# Patient Record
Sex: Male | Born: 1945
Health system: Southern US, Community
[De-identification: ages and names within clinical notes are randomized; demographics above are authoritative.]

## PROBLEM LIST (undated history)

## (undated) DIAGNOSIS — I2510 Atherosclerotic heart disease of native coronary artery without angina pectoris: Secondary | ICD-10-CM

## (undated) DIAGNOSIS — C801 Malignant (primary) neoplasm, unspecified: Secondary | ICD-10-CM

## (undated) DIAGNOSIS — K649 Unspecified hemorrhoids: Secondary | ICD-10-CM

## (undated) HISTORY — PX: NO PAST SURGERIES: SHX2092

---

## 1997-11-09 ENCOUNTER — Observation Stay (HOSPITAL_COMMUNITY): Admission: AD | Admit: 1997-11-09 | Discharge: 1997-11-10 | Payer: Self-pay | Admitting: Cardiology

## 2000-01-21 ENCOUNTER — Emergency Department (HOSPITAL_COMMUNITY): Admission: EM | Admit: 2000-01-21 | Discharge: 2000-01-21 | Payer: Self-pay | Admitting: Emergency Medicine

## 2000-01-21 ENCOUNTER — Encounter: Payer: Self-pay | Admitting: Emergency Medicine

## 2000-01-21 ENCOUNTER — Encounter: Payer: Self-pay | Admitting: *Deleted

## 2001-10-03 ENCOUNTER — Encounter: Admission: RE | Admit: 2001-10-03 | Discharge: 2001-10-03 | Payer: Self-pay | Admitting: Family Medicine

## 2001-10-03 ENCOUNTER — Encounter: Payer: Self-pay | Admitting: Family Medicine

## 2004-08-27 ENCOUNTER — Emergency Department (HOSPITAL_COMMUNITY): Admission: EM | Admit: 2004-08-27 | Discharge: 2004-08-27 | Payer: Self-pay | Admitting: Family Medicine

## 2004-12-21 ENCOUNTER — Encounter: Admission: RE | Admit: 2004-12-21 | Discharge: 2004-12-21 | Payer: Self-pay | Admitting: Occupational Medicine

## 2005-12-11 ENCOUNTER — Encounter: Admission: RE | Admit: 2005-12-11 | Discharge: 2005-12-11 | Payer: Self-pay | Admitting: Gastroenterology

## 2006-01-01 IMAGING — CR DG HUMERUS 2V *L*
4 series · 4 of 4 positions shown · non-contrast
Comparison: None.

CLINICAL DATA: Fell off ladder ? pain entire humerus.  
 LEFT HUMERUS ? 2 VIEW:

[view not recorded (1 of 4)]
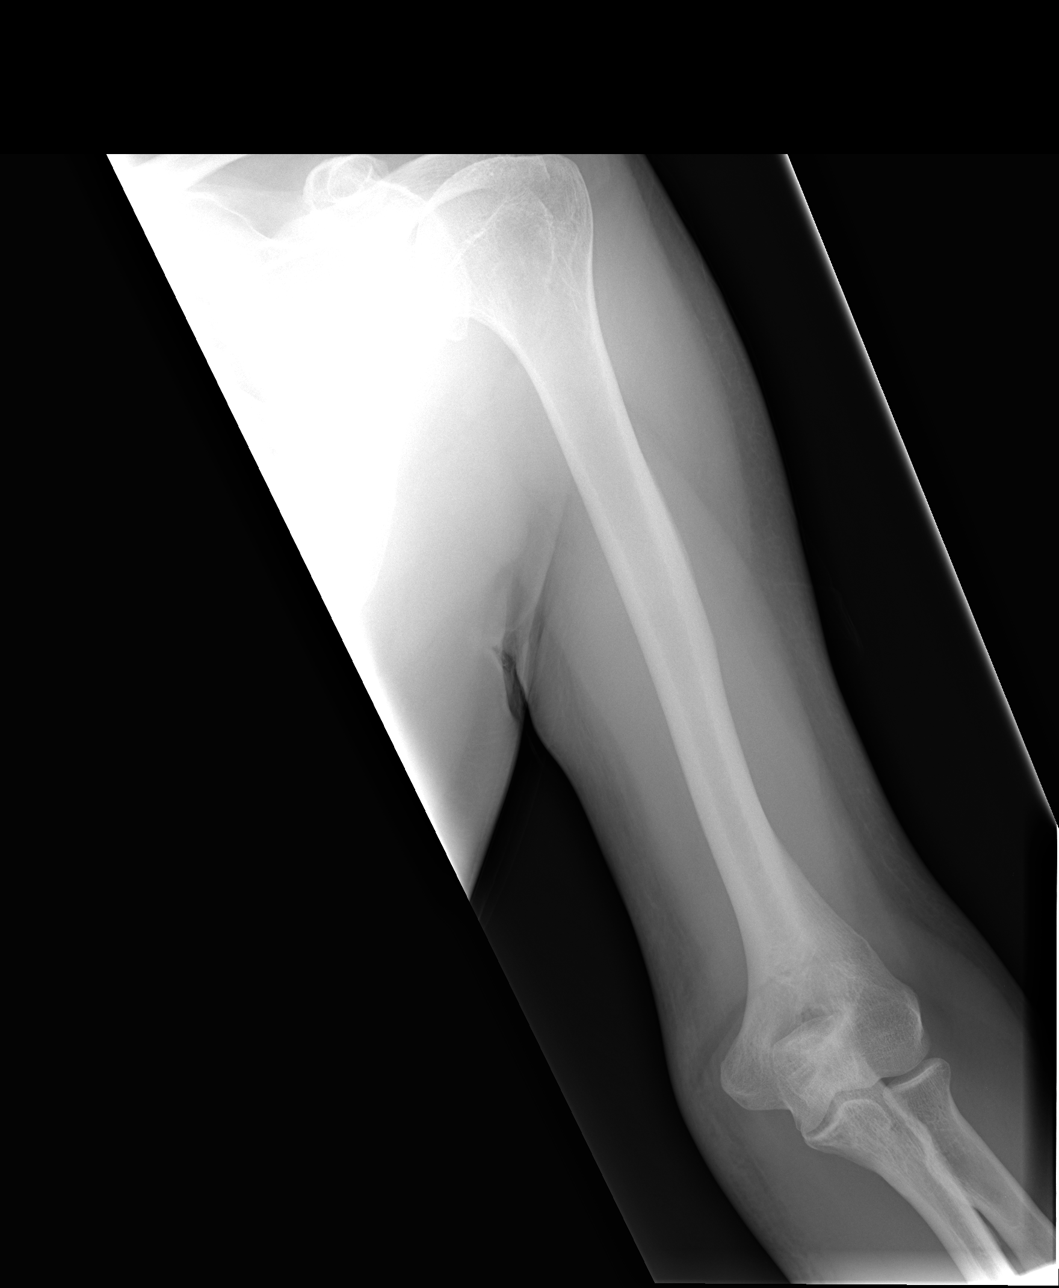

[view not recorded (2 of 4)]
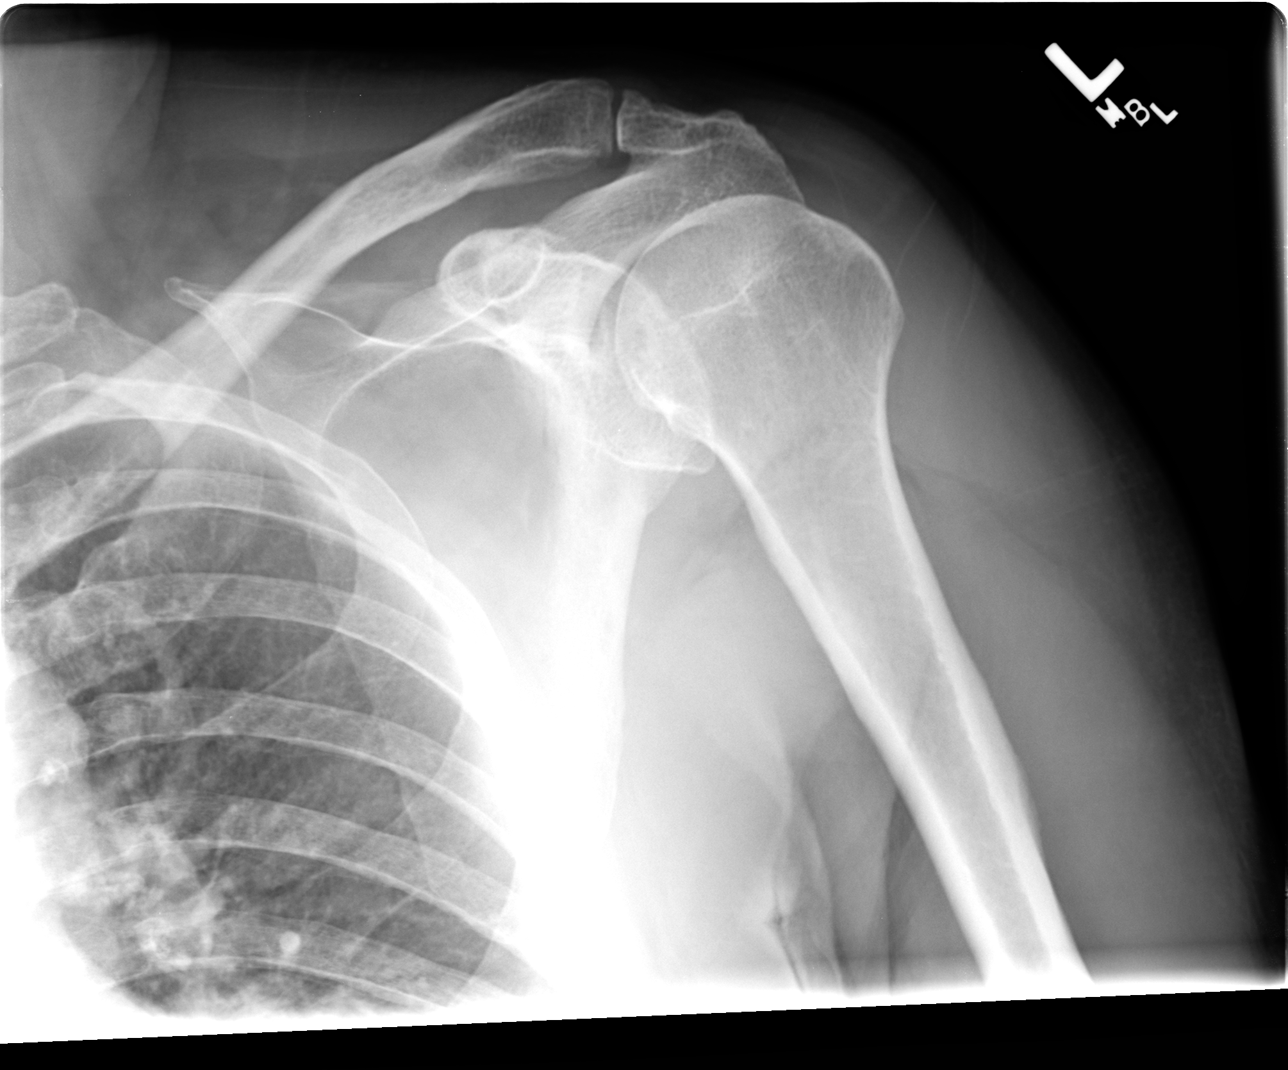

[view not recorded (3 of 4)]
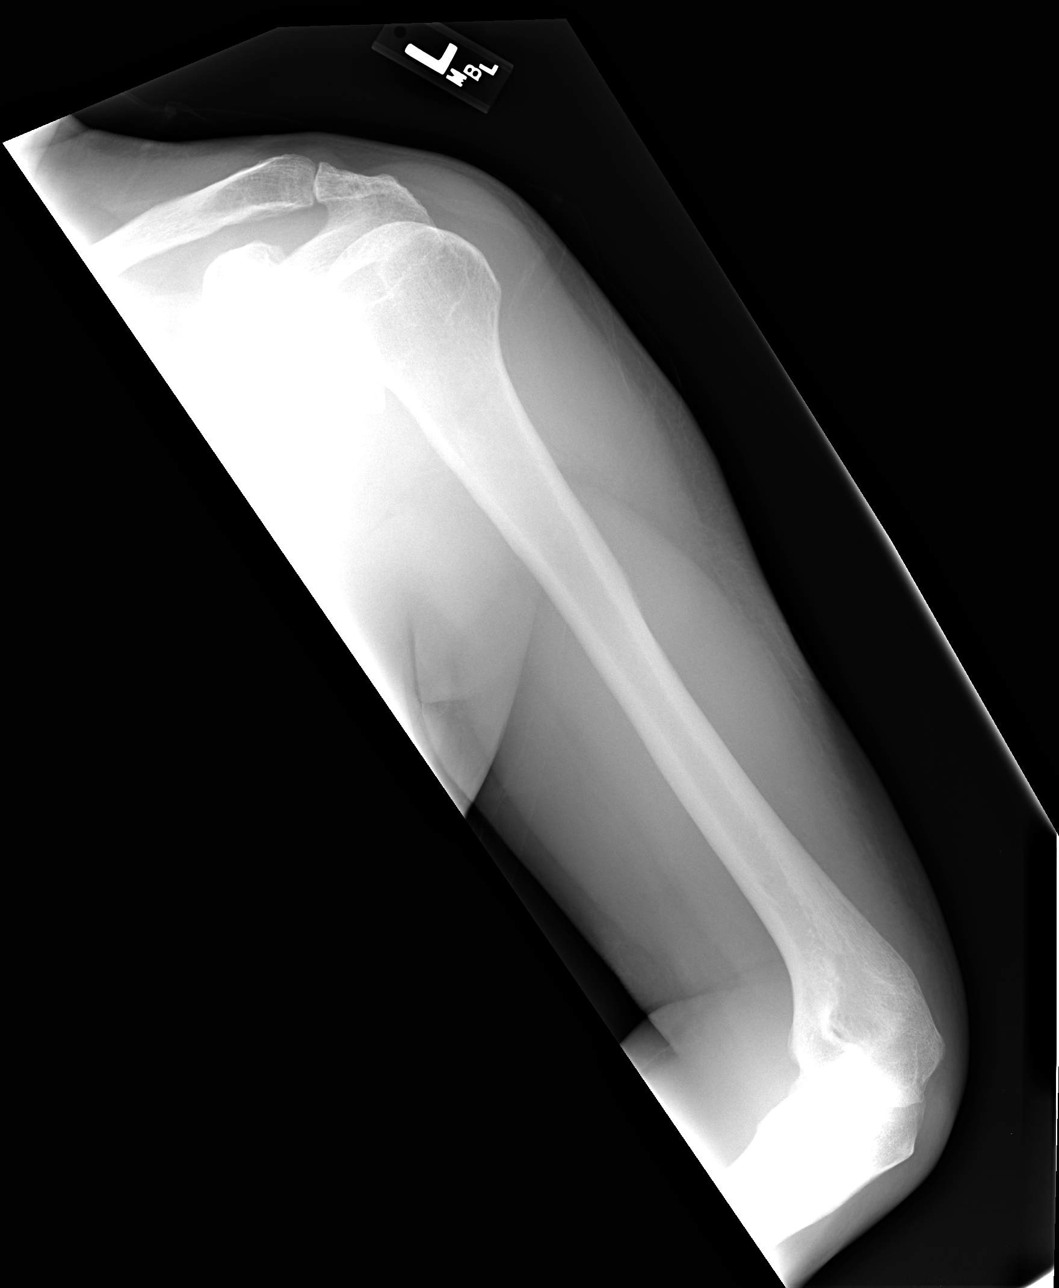

[view not recorded (4 of 4)]
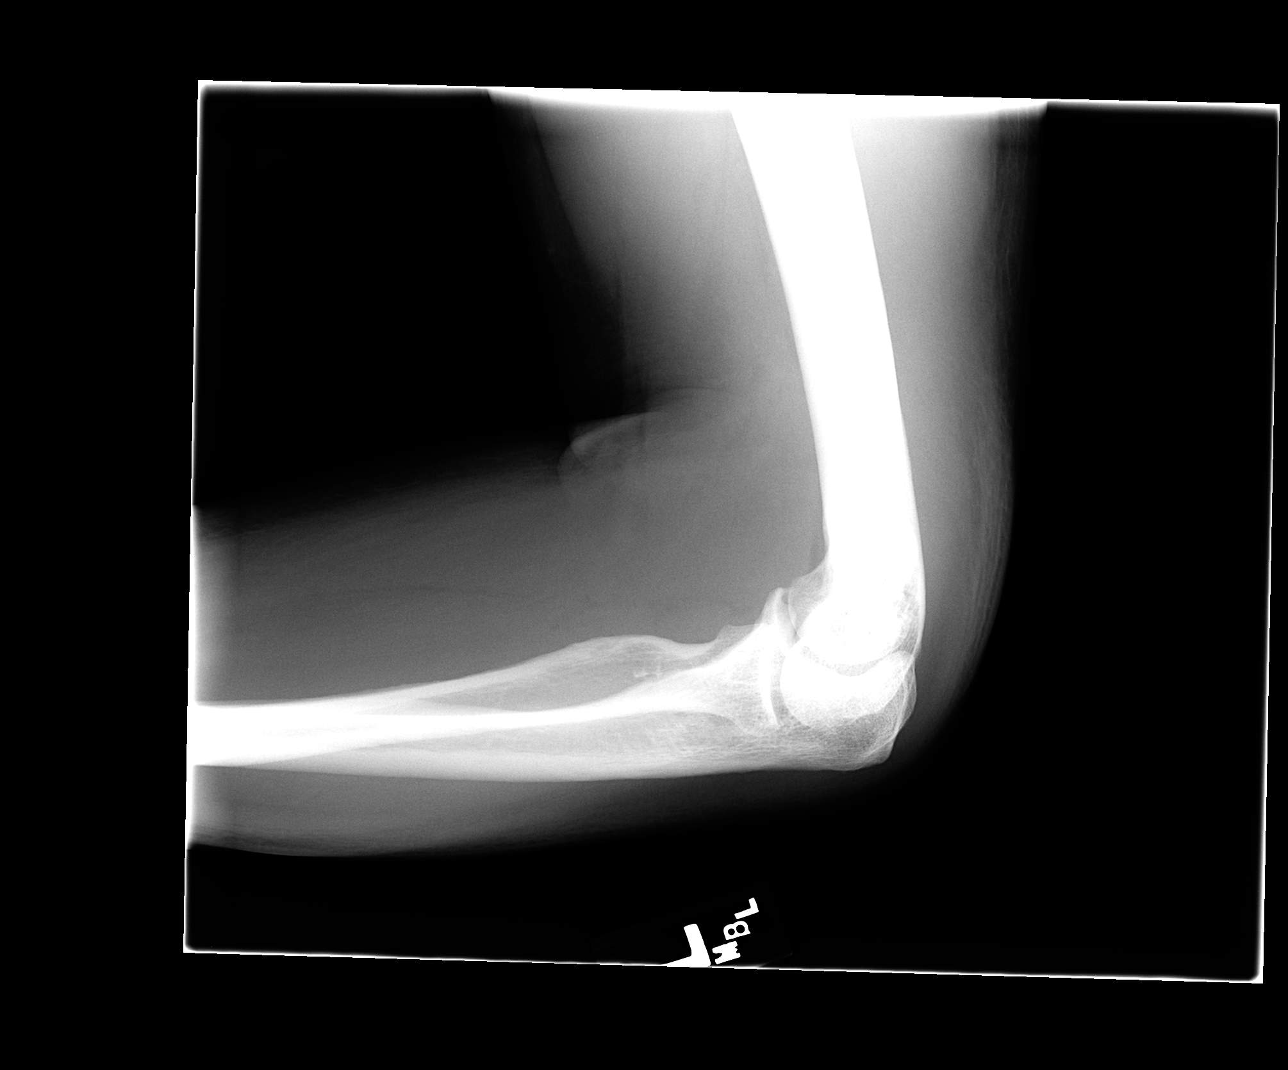

[4 of 4 positions shown; findings below may reference images not displayed]

FINDINGS: No fracture is noted.  There are degenerative changes of the AC joint.
IMPRESSION: No acute findings.

## 2006-12-22 IMAGING — CT CT VIRTUAL COLONOSCOPY DIAGNOSTIC
1 of 2 series · 13 of 32 positions shown, 19 images · non-contrast
Comparison: None.

CLINICAL DATA: Incomplete optical colonoscopy.  Positive stools.  Tortuous colon. 
 CT VIRTUAL COLONOSCOPY DIAGNOSTIC:
TECHNIQUE: The patient was given a standard low sodium bowel preparation with Gastrografin and barium for fluid and stool tagging respectively.  Automated CO2 insufflation of the colon was performed prior to scanning.   The quality of the bowel preparation is excellent.  The quality of colonic distention is good although the descending colon and a portion of sigmoid colon is not optimally distended on either supine or prone imaging.
TECHNIQUE: Multidetector CT imaging of the abdomen was performed following the standard protocol without IV contrast.
TECHNIQUE: Multidetector CT imaging of the pelvis was performed following the standard protocol without IV contrast.

[Series 3: supine · axial · 0.74mm/px · z∈[-440,+12]mm · 13 of 209 slices shown, 19 images]
[im 14/209  soft-tissue]
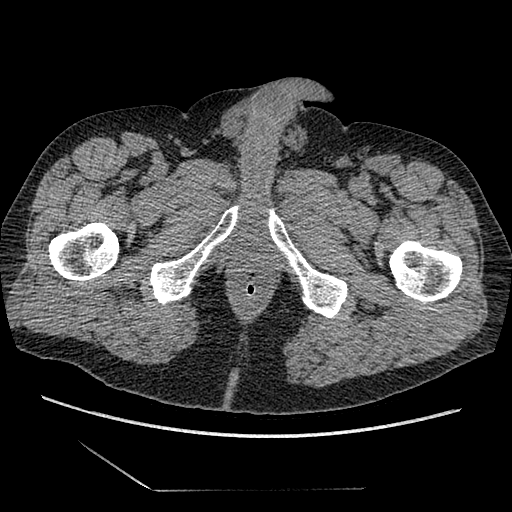
[im 14/209  bone]
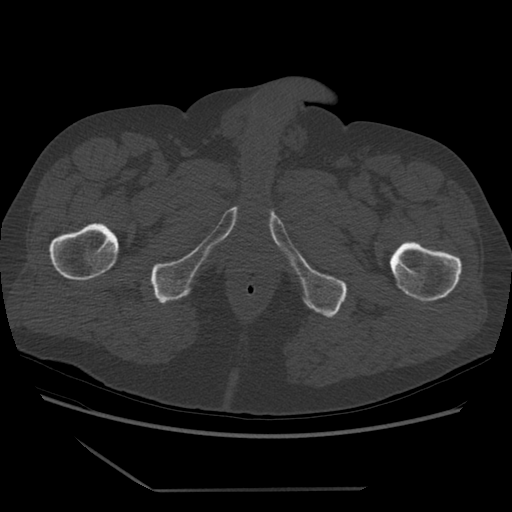
[im 28/209  soft-tissue]
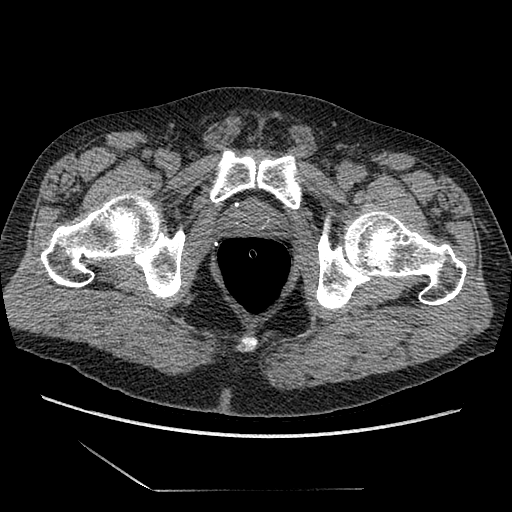
[im 42/209  soft-tissue]
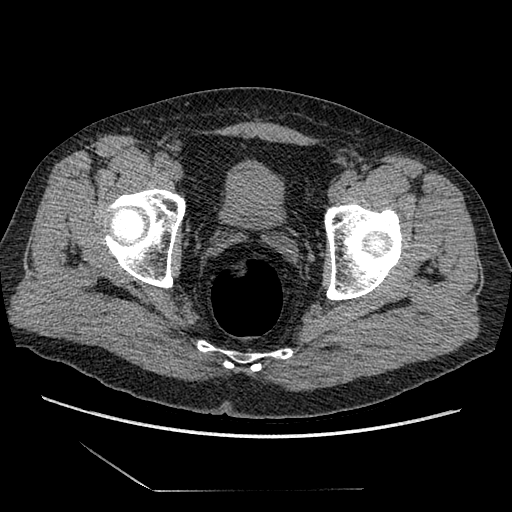
[im 56/209  soft-tissue]
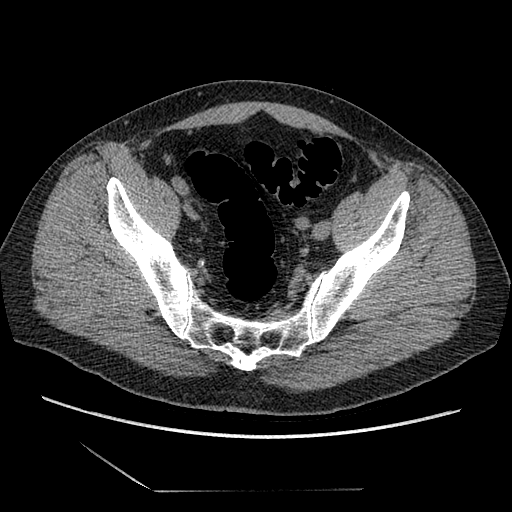
[im 70/209  soft-tissue]
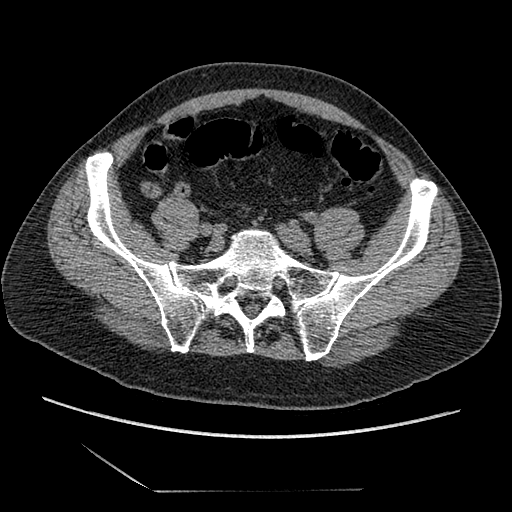
[im 84/209  soft-tissue]
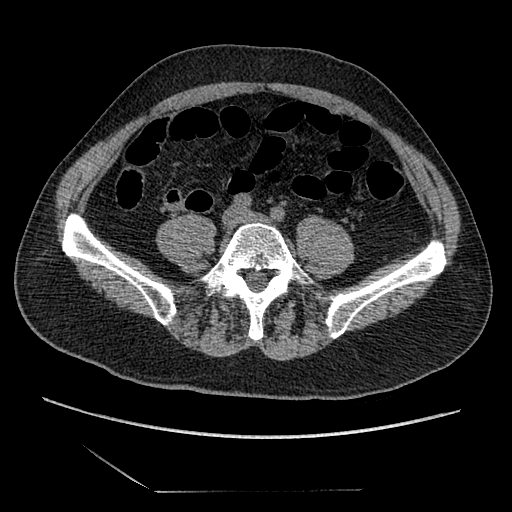
[im 111/209  soft-tissue]
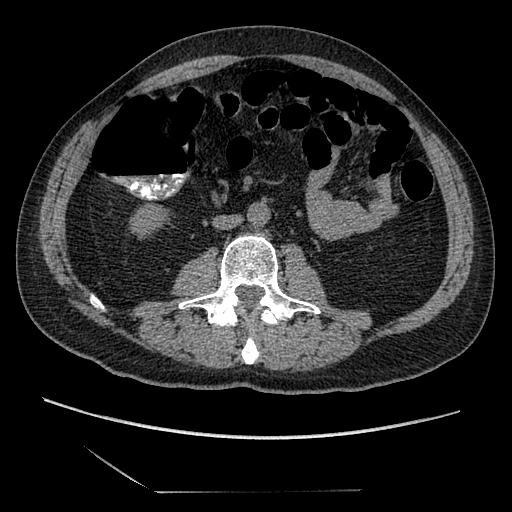
[im 125/209  soft-tissue]
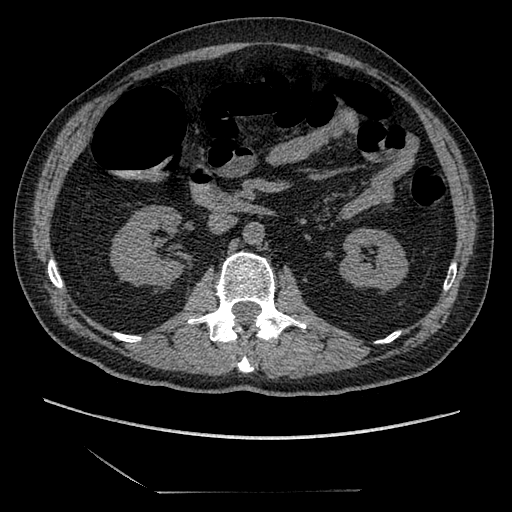
[im 139/209  soft-tissue]
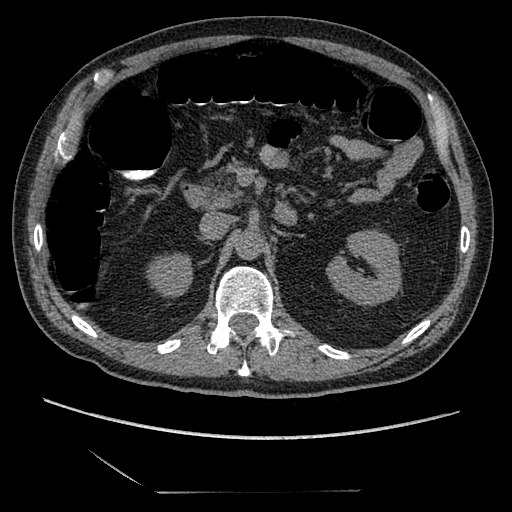
[im 139/209  bone]
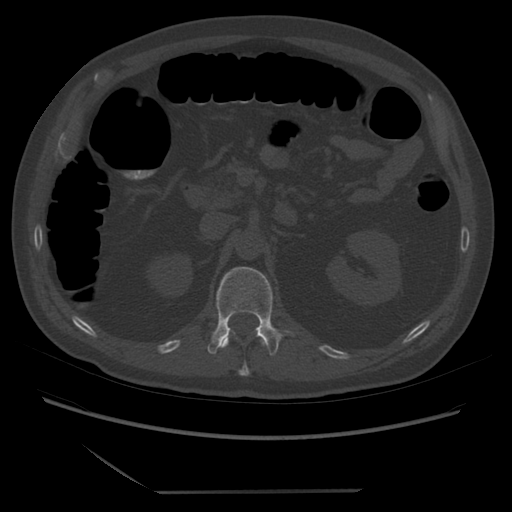
[im 153/209  soft-tissue]
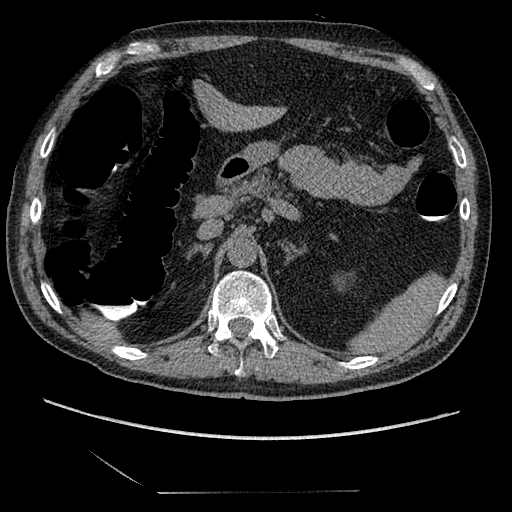
[im 153/209  lung]
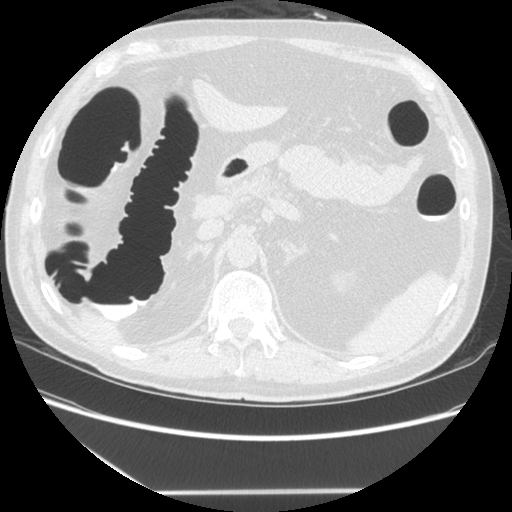
[im 167/209  soft-tissue]
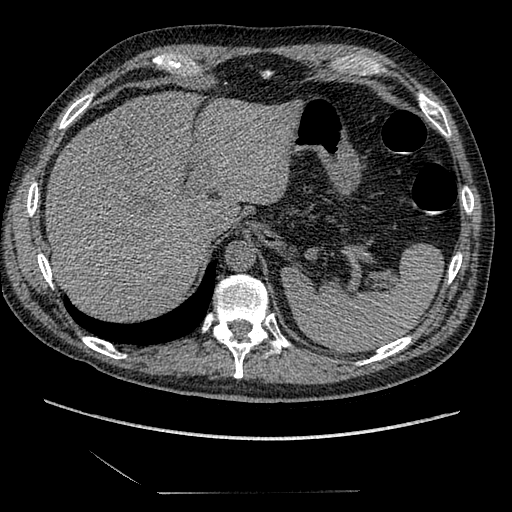
[im 167/209  lung]
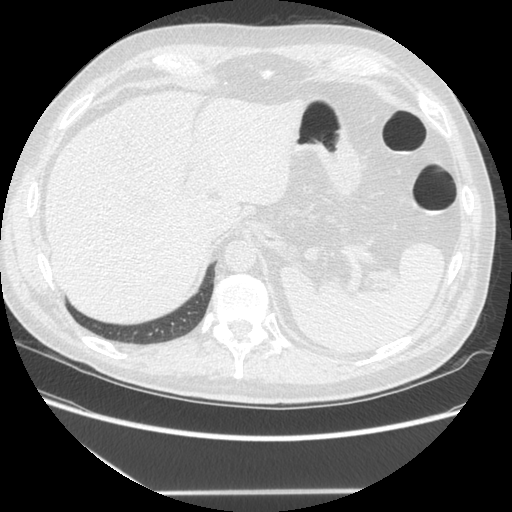
[im 181/209  soft-tissue]
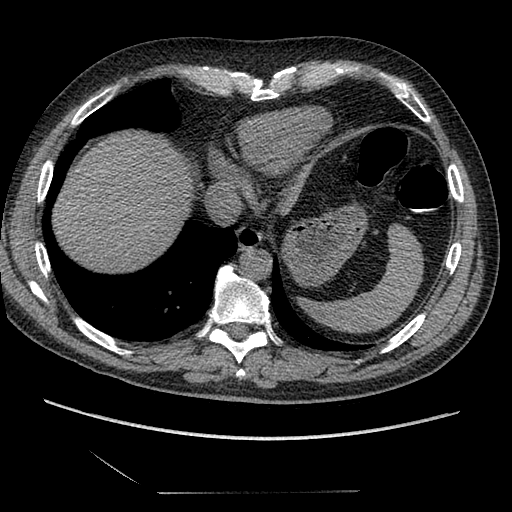
[im 181/209  lung]
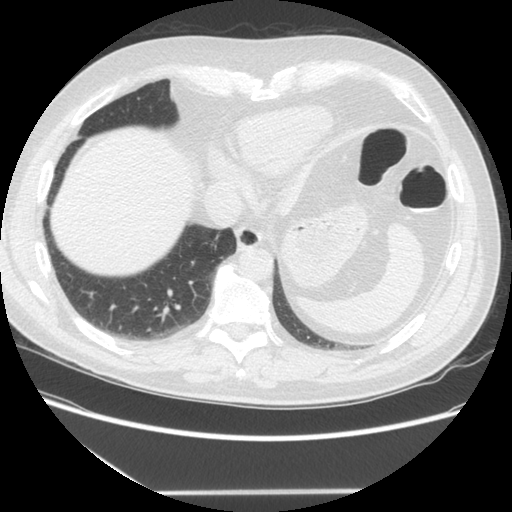
[im 195/209  soft-tissue]
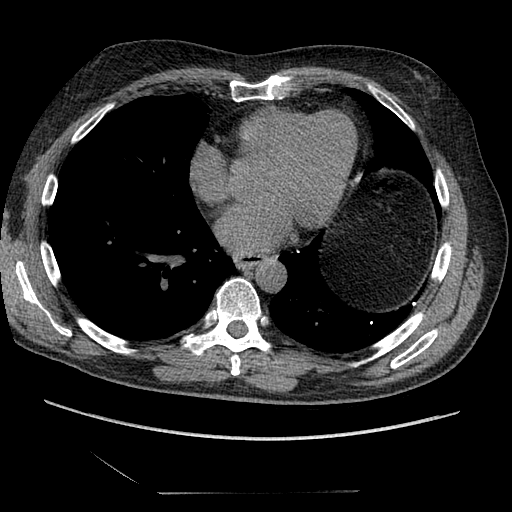
[im 195/209  lung]
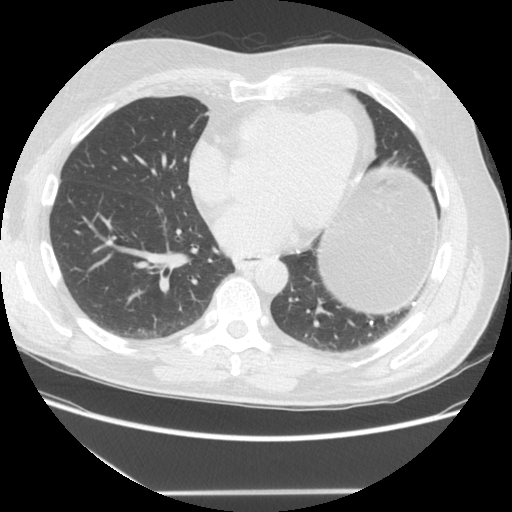

[13 of 32 positions shown; findings below may reference images not displayed]

FINDINGS: No clinically significant polyp is seen.  Virtual colonoscopy is not designed to detect diminutive polyps (i.e., less than or equal to 5 mm), the presence or absence of which may not affect clinical management.
IMPRESSION: 1.  No clinically significant polyp is noted.  The descending colon and part of the sigmoid colon is not well-distended on either supine or prone imaging and is difficult to assess.    
 2. C-Rads Category C1.  Normal Colon or Benign Lesion; Continue Routine Screening.
 ABDOMEN CT WITHOUT CONTRAST:
FINDINGS: The lung bases are clear with exception of a few small calcified granulomas.  Coronary artery calcifications are noted and calcified mediastinal nodes are present presumably due to prior granulomatous disease.  The liver, gallbladder, pancreas, adrenal glands, and spleen appear normal.  The kidneys appear normal in the unenhanced state.  The abdominal aorta is normal in caliber.
IMPRESSION: Negative unenhanced CT of the abdomen.  
 PELVIS CT WITHOUT CONTRAST:
FINDINGS: There is some thickening of the mucosa of the rectosigmoid colon probably due to mild diverticulosis with a few scattered diverticula present.  The urinary bladder is decompressed.  Prostatic calculi are noted.
IMPRESSION: Negative unenhanced CT of the pelvis.

## 2013-06-23 DIAGNOSIS — M7020 Olecranon bursitis, unspecified elbow: Secondary | ICD-10-CM | POA: Diagnosis not present

## 2013-06-23 DIAGNOSIS — M79609 Pain in unspecified limb: Secondary | ICD-10-CM | POA: Diagnosis not present

## 2013-10-23 DIAGNOSIS — N529 Male erectile dysfunction, unspecified: Secondary | ICD-10-CM | POA: Diagnosis not present

## 2013-10-23 DIAGNOSIS — R5383 Other fatigue: Secondary | ICD-10-CM | POA: Diagnosis not present

## 2013-10-23 DIAGNOSIS — Z1211 Encounter for screening for malignant neoplasm of colon: Secondary | ICD-10-CM | POA: Diagnosis not present

## 2013-10-23 DIAGNOSIS — Z125 Encounter for screening for malignant neoplasm of prostate: Secondary | ICD-10-CM | POA: Diagnosis not present

## 2013-10-23 DIAGNOSIS — I2510 Atherosclerotic heart disease of native coronary artery without angina pectoris: Secondary | ICD-10-CM | POA: Diagnosis not present

## 2013-10-23 DIAGNOSIS — E780 Pure hypercholesterolemia: Secondary | ICD-10-CM | POA: Diagnosis not present

## 2013-10-23 DIAGNOSIS — Z0000 Encounter for general adult medical examination without abnormal findings: Secondary | ICD-10-CM | POA: Diagnosis not present

## 2013-10-23 DIAGNOSIS — Z23 Encounter for immunization: Secondary | ICD-10-CM | POA: Diagnosis not present

## 2014-09-15 DIAGNOSIS — K625 Hemorrhage of anus and rectum: Secondary | ICD-10-CM | POA: Diagnosis not present

## 2014-09-15 DIAGNOSIS — Z23 Encounter for immunization: Secondary | ICD-10-CM | POA: Diagnosis not present

## 2014-09-15 DIAGNOSIS — N410 Acute prostatitis: Secondary | ICD-10-CM | POA: Diagnosis not present

## 2014-09-15 DIAGNOSIS — R109 Unspecified abdominal pain: Secondary | ICD-10-CM | POA: Diagnosis not present

## 2014-11-09 ENCOUNTER — Other Ambulatory Visit: Payer: Self-pay | Admitting: Family Medicine

## 2014-11-09 DIAGNOSIS — Z125 Encounter for screening for malignant neoplasm of prostate: Secondary | ICD-10-CM | POA: Diagnosis not present

## 2014-11-09 DIAGNOSIS — N5089 Other specified disorders of the male genital organs: Secondary | ICD-10-CM

## 2014-11-09 DIAGNOSIS — I2510 Atherosclerotic heart disease of native coronary artery without angina pectoris: Secondary | ICD-10-CM | POA: Diagnosis not present

## 2014-11-09 DIAGNOSIS — Z1211 Encounter for screening for malignant neoplasm of colon: Secondary | ICD-10-CM | POA: Diagnosis not present

## 2014-11-09 DIAGNOSIS — Z0000 Encounter for general adult medical examination without abnormal findings: Secondary | ICD-10-CM | POA: Diagnosis not present

## 2014-11-09 DIAGNOSIS — N529 Male erectile dysfunction, unspecified: Secondary | ICD-10-CM | POA: Diagnosis not present

## 2014-11-09 DIAGNOSIS — N509 Disorder of male genital organs, unspecified: Secondary | ICD-10-CM | POA: Diagnosis not present

## 2014-11-09 DIAGNOSIS — E7800 Pure hypercholesterolemia, unspecified: Secondary | ICD-10-CM | POA: Diagnosis not present

## 2014-11-10 ENCOUNTER — Ambulatory Visit
Admission: RE | Admit: 2014-11-10 | Discharge: 2014-11-10 | Disposition: A | Discharge status: Home / Self Care | Payer: Medicare Other | Source: Ambulatory Visit | Attending: Family Medicine | Admitting: Family Medicine

## 2014-11-10 ENCOUNTER — Other Ambulatory Visit: Payer: Self-pay | Admitting: Family Medicine

## 2014-11-10 DIAGNOSIS — N5089 Other specified disorders of the male genital organs: Secondary | ICD-10-CM

## 2015-09-15 DIAGNOSIS — Z23 Encounter for immunization: Secondary | ICD-10-CM | POA: Diagnosis not present

## 2015-11-11 DIAGNOSIS — Z0000 Encounter for general adult medical examination without abnormal findings: Secondary | ICD-10-CM | POA: Diagnosis not present

## 2015-11-11 DIAGNOSIS — D179 Benign lipomatous neoplasm, unspecified: Secondary | ICD-10-CM | POA: Diagnosis not present

## 2015-11-11 DIAGNOSIS — E7800 Pure hypercholesterolemia, unspecified: Secondary | ICD-10-CM | POA: Diagnosis not present

## 2015-11-11 DIAGNOSIS — N529 Male erectile dysfunction, unspecified: Secondary | ICD-10-CM | POA: Diagnosis not present

## 2015-11-11 DIAGNOSIS — Z8042 Family history of malignant neoplasm of prostate: Secondary | ICD-10-CM | POA: Diagnosis not present

## 2015-11-11 DIAGNOSIS — I2510 Atherosclerotic heart disease of native coronary artery without angina pectoris: Secondary | ICD-10-CM | POA: Diagnosis not present

## 2015-11-11 DIAGNOSIS — Z1211 Encounter for screening for malignant neoplasm of colon: Secondary | ICD-10-CM | POA: Diagnosis not present

## 2015-11-11 DIAGNOSIS — R200 Anesthesia of skin: Secondary | ICD-10-CM | POA: Diagnosis not present

## 2015-11-11 DIAGNOSIS — M542 Cervicalgia: Secondary | ICD-10-CM | POA: Diagnosis not present

## 2015-11-12 ENCOUNTER — Other Ambulatory Visit: Payer: Self-pay | Admitting: Family Medicine

## 2015-11-12 DIAGNOSIS — R200 Anesthesia of skin: Secondary | ICD-10-CM

## 2015-11-17 ENCOUNTER — Ambulatory Visit
Admission: RE | Admit: 2015-11-17 | Discharge: 2015-11-17 | Disposition: A | Discharge status: Home / Self Care | Payer: Medicare Other | Source: Ambulatory Visit | Attending: Family Medicine | Admitting: Family Medicine

## 2015-11-17 DIAGNOSIS — R200 Anesthesia of skin: Secondary | ICD-10-CM

## 2015-11-21 IMAGING — US US SCROTUM
1 series · 14 of 25 positions shown · non-contrast
Comparison: None.

CLINICAL DATA: Enlarged right testicle.

EXAM:
ULTRASOUND OF SCROTUM
TECHNIQUE: Complete ultrasound examination of the testicles, epididymis, and
other scrotal structures was performed.

[Series 1: us scrotum · 14 of 35 slices shown]
[im 1/35]
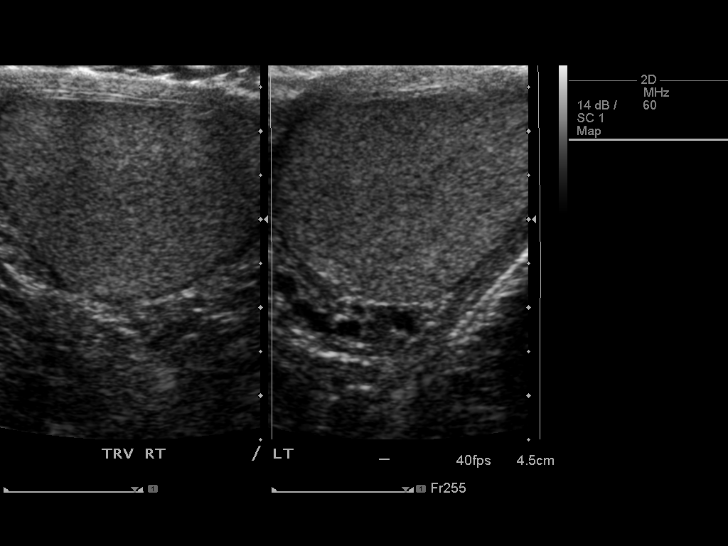
[im 3/35]
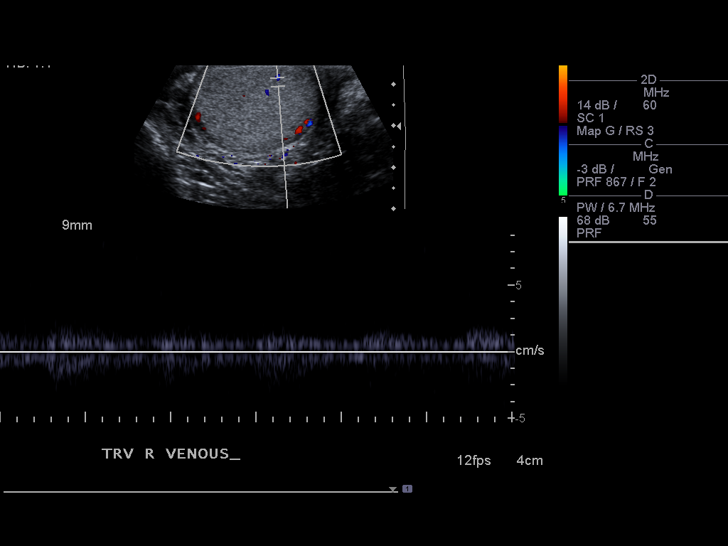
[im 6/35]
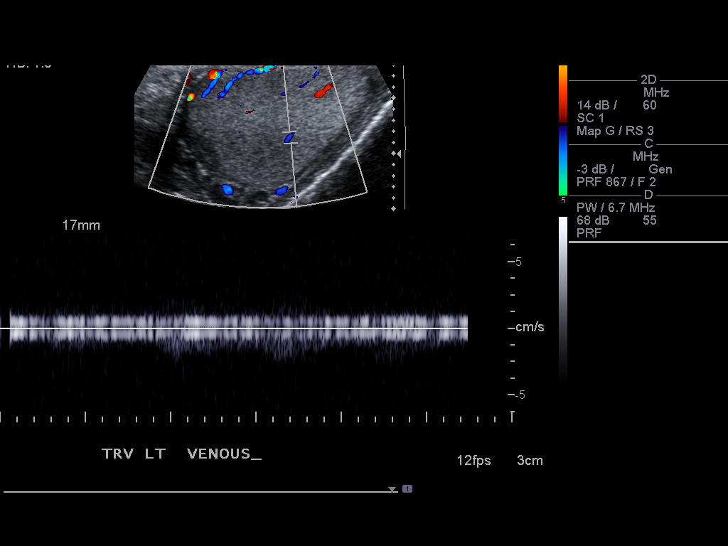
[im 9/35]
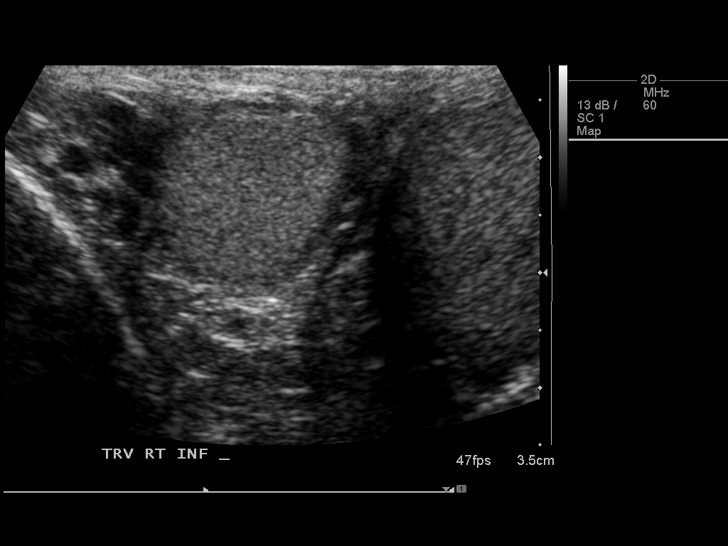
[im 12/35]
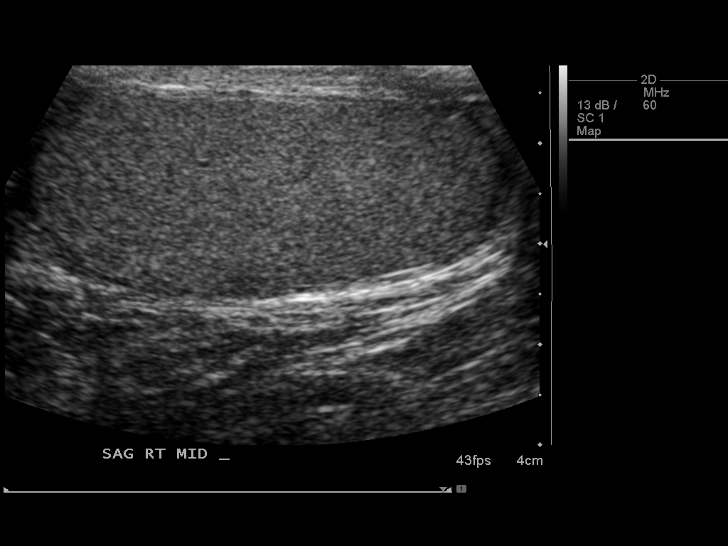
[im 13/35]
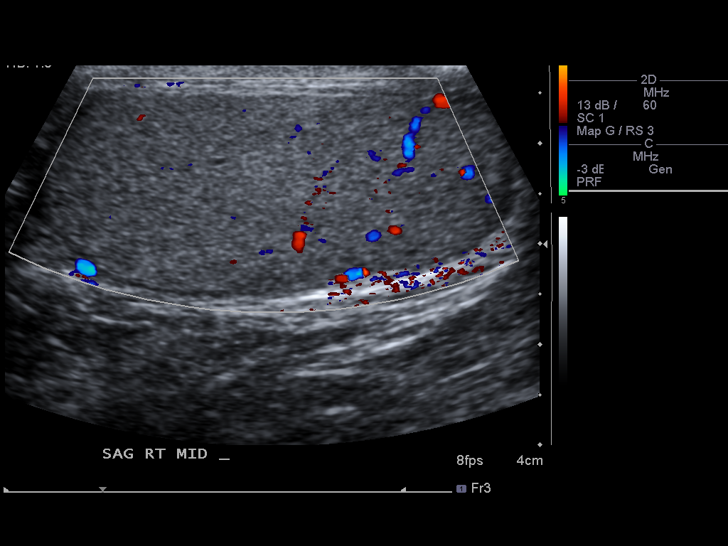
[im 16/35]
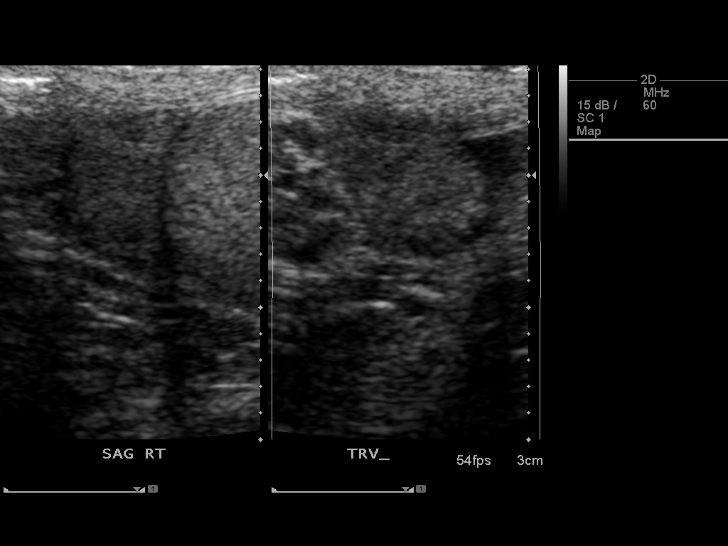
[im 19/35]
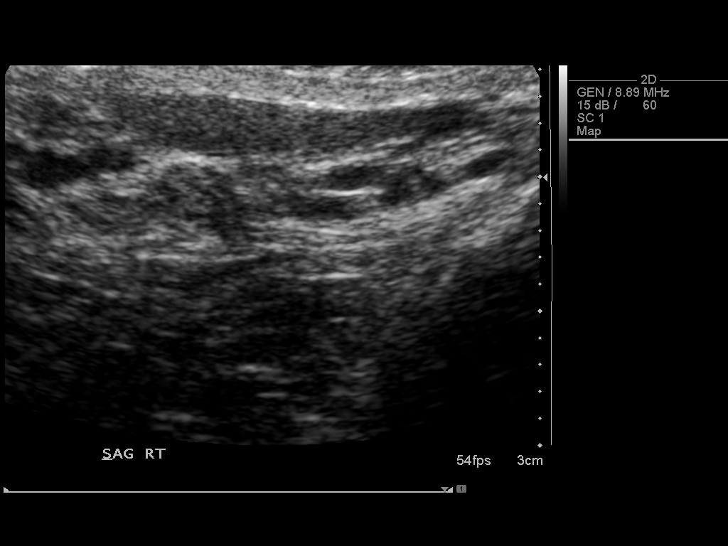
[im 22/35]
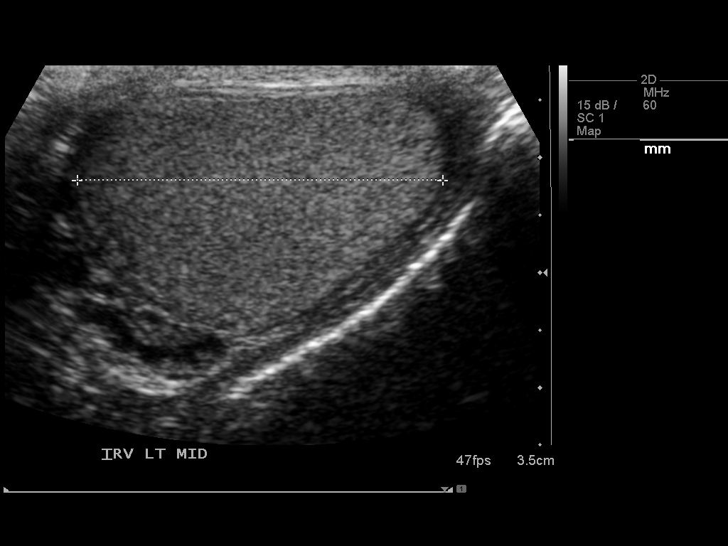
[im 23/35]
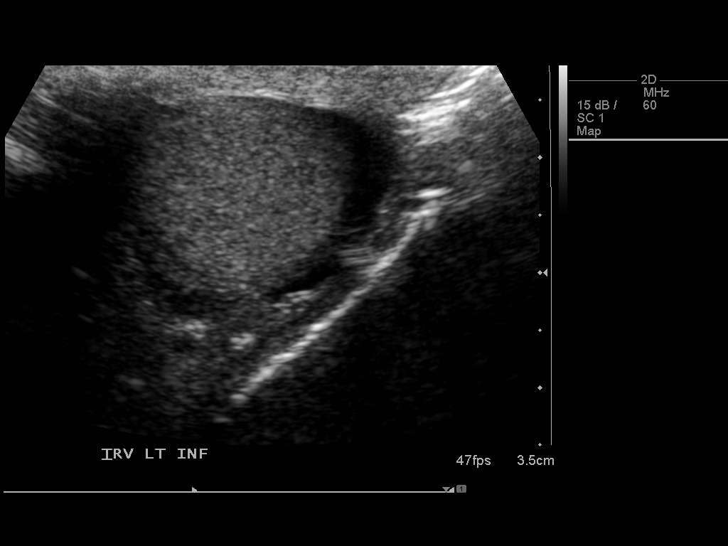
[im 26/35]
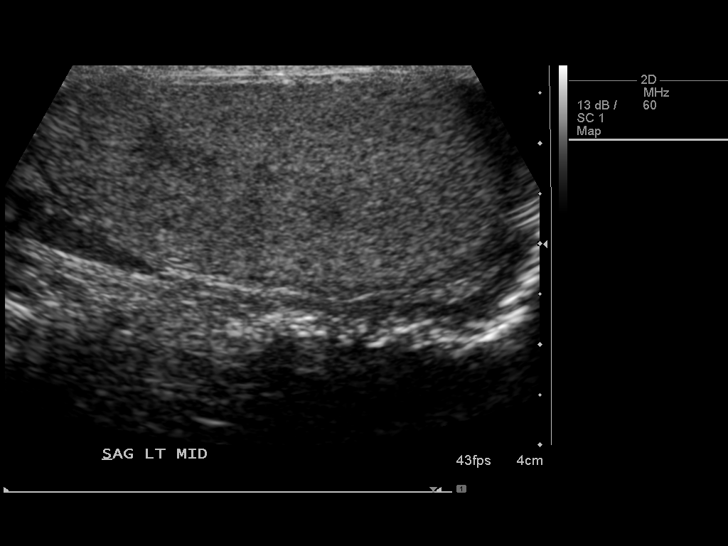
[im 29/35]
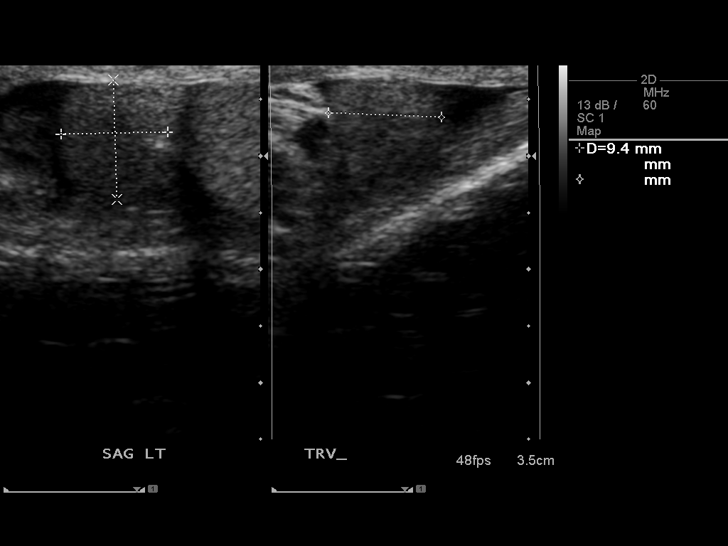
[im 32/35]
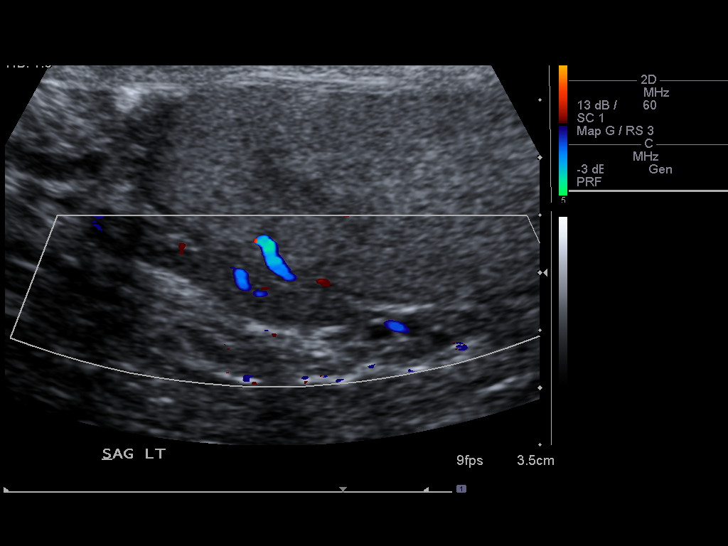
[im 35/35]
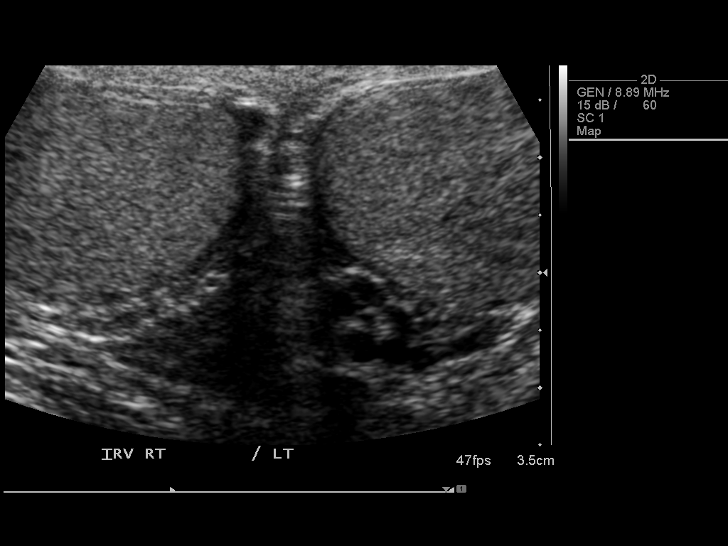

[14 of 25 positions shown; findings below may reference images not displayed]

FINDINGS: Right testicle

Measurements: The right testicle measures 4.8 x 2.0 x 3.4 cm.. No
intratesticular abnormality is seen. Blood flow is demonstrated to
the right testicle with arterial and venous waveforms.

Left testicle

Measurements: The left testicle measures 4.8 x 2.2 x 3.2 cm.. No
intratesticular abnormality is seen. Blood flow is demonstrated to
the left testicle with arterial and venous waveforms.

Right epididymis:  The right epididymis is unremarkable.

Left epididymis:  The left epididymis is unremarkable.

Hydrocele:  No hydrocele is seen.

Varicocele:  No varicocele is noted.
IMPRESSION: Negative ultrasound of the scrotum. Both testicles are normal in
size and blood flow is demonstrated to both testicles with arterial
and venous waveforms.

## 2015-11-25 ENCOUNTER — Other Ambulatory Visit: Payer: Self-pay | Admitting: Gastroenterology

## 2016-02-08 ENCOUNTER — Ambulatory Visit (HOSPITAL_COMMUNITY): Payer: BLUE CROSS/BLUE SHIELD | Admitting: Certified Registered Nurse Anesthetist

## 2016-02-08 ENCOUNTER — Encounter (HOSPITAL_COMMUNITY)
Admission: RE | Disposition: A | Discharge status: Home / Self Care | Payer: Self-pay | Source: Ambulatory Visit | Attending: Gastroenterology

## 2016-02-08 ENCOUNTER — Ambulatory Visit (HOSPITAL_COMMUNITY)
Admission: RE | Admit: 2016-02-08 | Discharge: 2016-02-08 | Disposition: A | Discharge status: Home / Self Care | Payer: BLUE CROSS/BLUE SHIELD | Source: Ambulatory Visit | Attending: Gastroenterology | Admitting: Gastroenterology

## 2016-02-08 ENCOUNTER — Encounter (HOSPITAL_COMMUNITY): Payer: Self-pay

## 2016-02-08 DIAGNOSIS — Z1211 Encounter for screening for malignant neoplasm of colon: Secondary | ICD-10-CM | POA: Insufficient documentation

## 2016-02-08 DIAGNOSIS — Z955 Presence of coronary angioplasty implant and graft: Secondary | ICD-10-CM | POA: Insufficient documentation

## 2016-02-08 DIAGNOSIS — K562 Volvulus: Secondary | ICD-10-CM | POA: Diagnosis not present

## 2016-02-08 DIAGNOSIS — I2510 Atherosclerotic heart disease of native coronary artery without angina pectoris: Secondary | ICD-10-CM | POA: Diagnosis not present

## 2016-02-08 DIAGNOSIS — E7800 Pure hypercholesterolemia, unspecified: Secondary | ICD-10-CM | POA: Diagnosis not present

## 2016-02-08 HISTORY — PX: COLONOSCOPY WITH PROPOFOL: SHX5780

## 2016-02-08 SURGERY — COLONOSCOPY WITH PROPOFOL
Anesthesia: Monitor Anesthesia Care

## 2016-02-08 MED ORDER — LACTATED RINGERS IV SOLN
INTRAVENOUS | Status: DC | PRN
  Administered 2016-02-08: 11:00:00 via INTRAVENOUS

## 2016-02-08 MED ORDER — PROPOFOL 10 MG/ML IV BOLUS
INTRAVENOUS | Status: DC | PRN
Start: 2016-02-08 — End: 2016-02-08
  Administered 2016-02-08: 40 mg via INTRAVENOUS
  Administered 2016-02-08 (×2): 20 mg via INTRAVENOUS
  Administered 2016-02-08: 40 mg via INTRAVENOUS
  Administered 2016-02-08: 20 mg via INTRAVENOUS
  Administered 2016-02-08 (×2): 40 mg via INTRAVENOUS
  Administered 2016-02-08: 20 mg via INTRAVENOUS

## 2016-02-08 MED ORDER — SODIUM CHLORIDE 0.9 % IV SOLN: INTRAVENOUS | Status: DC

## 2016-02-08 MED ORDER — PROPOFOL 10 MG/ML IV BOLUS
INTRAVENOUS | Status: AC
  Filled 2016-02-08: qty 40

## 2016-02-08 MED ORDER — LACTATED RINGERS IV SOLN
INTRAVENOUS | Status: DC
  Administered 2016-02-08: 11:00:00 via INTRAVENOUS

## 2016-02-08 SURGICAL SUPPLY — 22 items

## 2016-02-08 NOTE — H&P (Signed)
Procedure: Screening colonoscopy. 2007 normal incomplete screening colonoscopy to the hepatic flexure followed by virtual colonoscopy.  History: The patient is a 71 year old male born [birth year]. He is scheduled to undergo a repeat screening colonoscopy today.  Past medical history: Coronary artery disease. Coronary artery stent placement in 1999. Hypercholesterolemia.  Exam: The patient is alert and lying comfortably on the endoscopy stretcher. Abdomen is soft and nontender to palpation. Lungs are clear to auscultation. Cardiac exam reveals a regular rhythm.  Plan: Proceed with screening colonoscopy

## 2016-02-08 NOTE — Op Note (Signed)
[facility] Patient Name: Steven Harrison Procedure Date: 02/08/2016 MRN: [id number] Attending MD: Garlan Fair , MD Date of Birth: 08/12/1945 CSN: [id number] Age: 71 Admit Type: Outpatient Procedure:                Colonoscopy Indications:              Screening for colorectal malignant neoplasm Providers:                Garlan Fair, MD, Carmie End, RN, Elspeth Cho Tech., Technician, Dion Saucier, CRNA Referring MD:              Medicines:                Propofol per Anesthesia Complications:            No immediate complications. Estimated Blood Loss:     Estimated blood loss: none. Procedure:                Pre-Anesthesia Assessment:                           - Prior to the procedure, a History and Physical                            was performed, and patient medications and                            allergies were reviewed. The patient's tolerance of                            previous anesthesia was also reviewed. The risks                            and benefits of the procedure and the sedation                            options and risks were discussed with the patient.                            All questions were answered, and informed consent                            was obtained. Prior Anticoagulants: The patient has                            taken aspirin, last dose was day of procedure. ASA                            Grade Assessment: II - A patient with mild systemic                            disease. After reviewing the risks and benefits,  the patient was deemed in satisfactory condition to                            undergo the procedure.                           After obtaining informed consent, the colonoscope                            was passed under direct vision. Throughout the                            procedure, the patient's blood pressure, pulse, and               oxygen saturations were monitored continuously. The                            [id number] scope was introduced through                            the anus and advanced to the the cecum, identified                            by appendiceal orifice and ileocecal valve. The                            colonoscopy was technically difficult and complex                            due to significant looping. The patient tolerated                            the procedure well. The quality of the bowel                            preparation was good. The appendiceal orifice and                            the rectum were photographed. Findings:      The perianal and digital rectal examinations were normal.      The entire examined colon appeared normal. Impression:               - The entire examined colon is normal.                           - No specimens collected. Moderate Sedation:      N/A- Per Anesthesia Care Recommendation:           - Patient has a contact number available for                            emergencies. The signs and symptoms of potential                            delayed complications were discussed  with the                            patient. Return to normal activities tomorrow.                            Written discharge instructions were provided to the                            patient.                           - Repeat colonoscopy is not recommended for                            screening purposes.                           - Resume previous diet.                           - Continue present medications. Procedure Code(s):        --- Professional ---                           [id number], Colorectal cancer screening; colonoscopy on                            individual not meeting criteria for high risk Diagnosis Code(s):        --- Professional ---                           Z12.11, Encounter for screening for malignant                             neoplasm of colon CPT copyright 2016 [employer]. All rights reserved. The codes documented in this report are preliminary and upon coder review may  be revised to meet current compliance requirements. Earle Gell, MD Garlan Fair, MD 02/08/2016 11:28:36 AM This report has been signed electronically. Number of Addenda: 0

## 2016-02-08 NOTE — Anesthesia Postprocedure Evaluation (Addendum)
Anesthesia Post Note  Patient: Steven Harrison  Procedure(s) Performed: Procedure(s) (LRB): COLONOSCOPY WITH PROPOFOL (N/A)  Patient location during evaluation: PACU Anesthesia Type: MAC Level of consciousness: awake and alert Pain management: pain level controlled Vital Signs Assessment: post-procedure vital signs reviewed and stable Respiratory status: spontaneous breathing, nonlabored ventilation, respiratory function stable and patient connected to nasal cannula oxygen Cardiovascular status: stable and blood pressure returned to baseline Anesthetic complications: no       Last Vitals:  Vitals:   02/08/16 1023 02/08/16 1126  BP: 138/74 126/78  Pulse: (!) 52 (!) 58  Resp: 13 15  Temp: 36.4 C 36.4 C    Last Pain:  Vitals:   02/08/16 1126  TempSrc: Oral                 [name]

## 2016-02-08 NOTE — Anesthesia Preprocedure Evaluation (Signed)
Anesthesia Evaluation  Patient identified by MRN, date of birth, ID band Patient awake    Reviewed: Allergy & Precautions, NPO status , Patient's Chart, lab work & pertinent test results  Airway Mallampati: II  TM Distance: >3 FB Neck ROM: Full    Dental no notable dental hx.    Pulmonary neg pulmonary ROS,    Pulmonary exam normal breath sounds clear to auscultation       Cardiovascular negative cardio ROS Normal cardiovascular exam Rhythm:Regular Rate:Normal     Neuro/Psych negative neurological ROS  negative psych ROS   GI/Hepatic negative GI ROS, Neg liver ROS,   Endo/Other  negative endocrine ROS  Renal/GU negative Renal ROS  negative genitourinary   Musculoskeletal negative musculoskeletal ROS (+)   Abdominal   Peds negative pediatric ROS (+)  Hematology negative hematology ROS (+)   Anesthesia Other Findings   Reproductive/Obstetrics negative OB ROS                             Anesthesia Physical Anesthesia Plan  ASA: I  Anesthesia Plan: MAC   Post-op Pain Management:    Induction: Intravenous  Airway Management Planned: Nasal Cannula  Additional Equipment:   Intra-op Plan:   Post-operative Plan:   Informed Consent: I have reviewed the patients History and Physical, chart, labs and discussed the procedure including the risks, benefits and alternatives for the proposed anesthesia with the patient or authorized representative who has indicated his/her understanding and acceptance.   Dental advisory given  Plan Discussed with: CRNA and Surgeon  Anesthesia Plan Comments:         Anesthesia Quick Evaluation  

## 2016-02-08 NOTE — Transfer of Care (Signed)
Immediate Anesthesia Transfer of Care Note  Patient: Steven Harrison  Procedure(s) Performed: Procedure(s): COLONOSCOPY WITH PROPOFOL (N/A)  Patient Location: PACU and Endoscopy Unit  Anesthesia Type:MAC  Level of Consciousness: awake, oriented, patient cooperative, lethargic and responds to stimulation  Airway & Oxygen Therapy: Patient Spontanous Breathing and Patient connected to face mask oxygen  Post-op Assessment: Report given to RN, Post -op Vital signs reviewed and stable and Patient moving all extremities  Post vital signs: Reviewed and stable  Last Vitals:  Vitals:   02/08/16 1023  BP: 138/74  Pulse: (!) 52  Resp: 13  Temp: 36.4 C    Last Pain:  Vitals:   02/08/16 1023  TempSrc: Oral         Complications: No apparent anesthesia complications

## 2016-02-08 NOTE — Discharge Instructions (Signed)

## 2016-02-09 ENCOUNTER — Encounter (HOSPITAL_COMMUNITY): Payer: Self-pay | Admitting: Gastroenterology

## 2016-03-21 DIAGNOSIS — H52209 Unspecified astigmatism, unspecified eye: Secondary | ICD-10-CM | POA: Diagnosis not present

## 2016-03-21 DIAGNOSIS — H02831 Dermatochalasis of right upper eyelid: Secondary | ICD-10-CM | POA: Diagnosis not present

## 2016-03-21 DIAGNOSIS — H2513 Age-related nuclear cataract, bilateral: Secondary | ICD-10-CM | POA: Diagnosis not present

## 2016-03-21 DIAGNOSIS — H02834 Dermatochalasis of left upper eyelid: Secondary | ICD-10-CM | POA: Diagnosis not present

## 2016-06-12 NOTE — Addendum Note (Signed)
Addendum  created 06/12/16 1033 by Timberlyn Pickford, MD   Sign clinical note    

## 2016-09-26 DIAGNOSIS — Z23 Encounter for immunization: Secondary | ICD-10-CM | POA: Diagnosis not present

## 2016-11-27 IMAGING — US US CAROTID DUPLEX BILAT
1 series · 13 of 24 positions shown · non-contrast
Comparison: None.

CLINICAL DATA: Intermittent numbness in the left arm and face.

EXAM:
BILATERAL CAROTID DUPLEX ULTRASOUND
TECHNIQUE: Gray scale imaging, color Doppler and duplex ultrasound were
performed of bilateral carotid and vertebral arteries in the neck.

[Series 1: us carotid duplex bilat · 0.08mm/px · 13 of 69 slices shown]
[im 1/69]
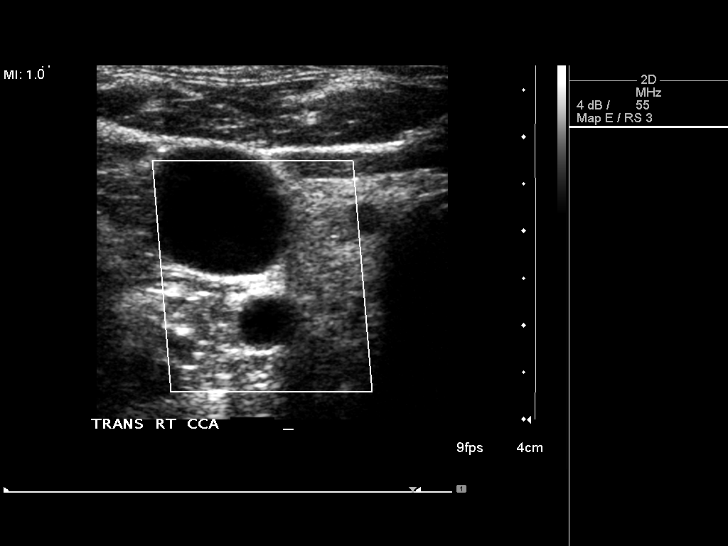
[im 6/69]
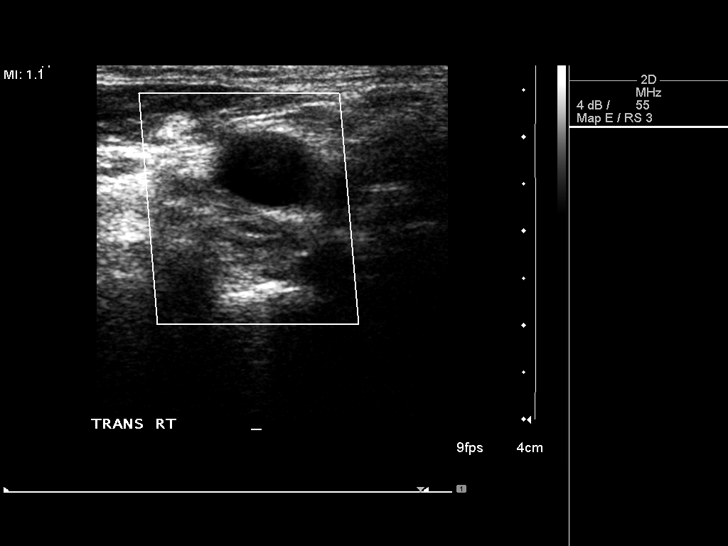
[im 12/69]
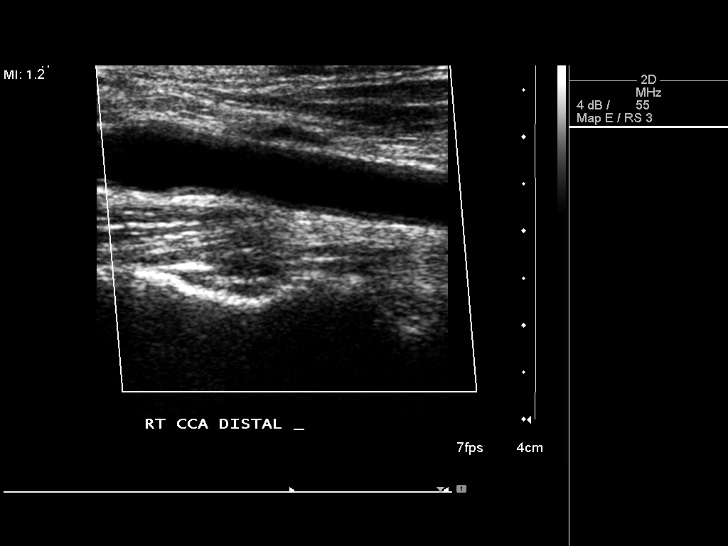
[im 18/69]
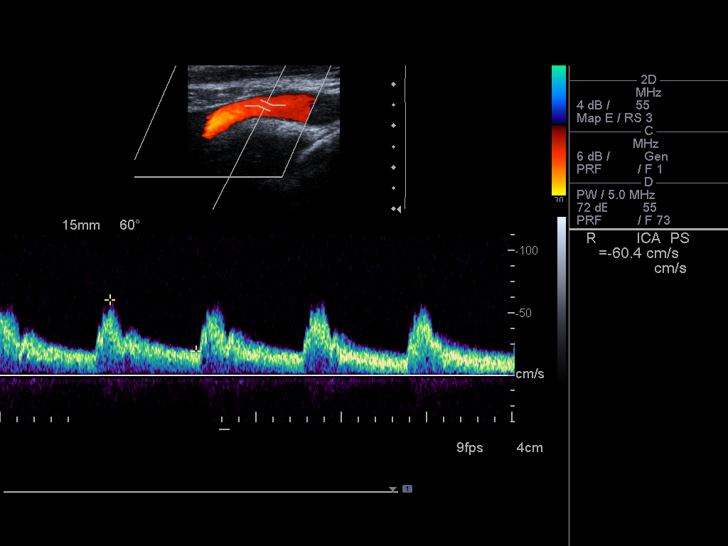
[im 24/69]
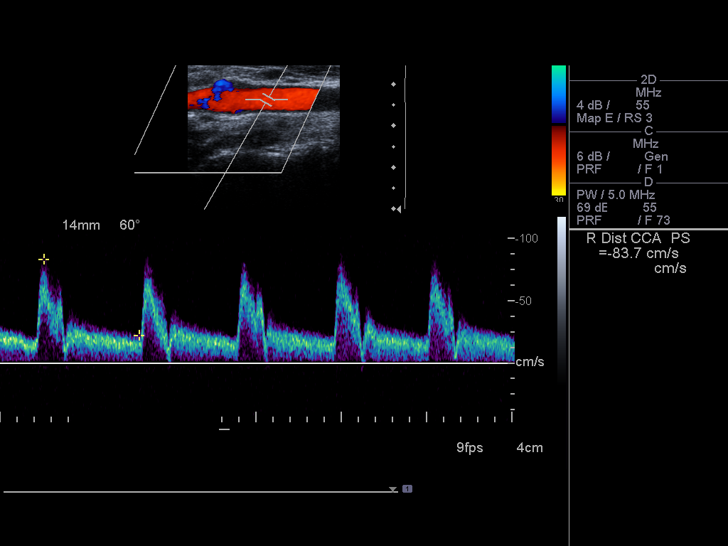
[im 30/69]
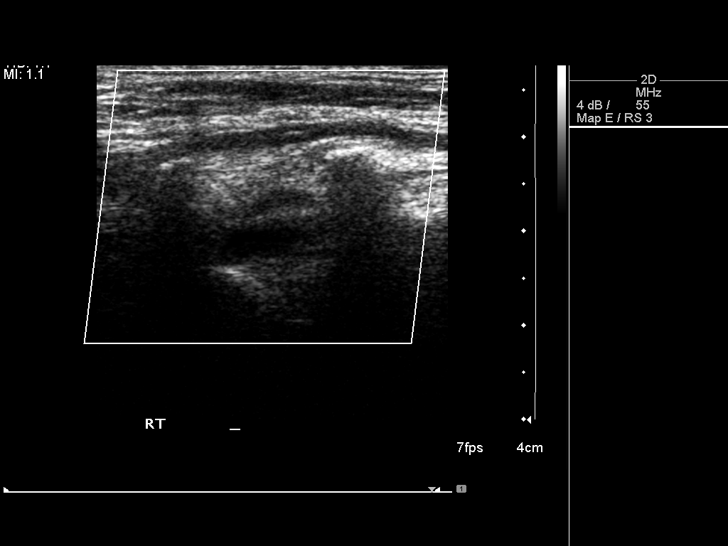
[im 36/69]
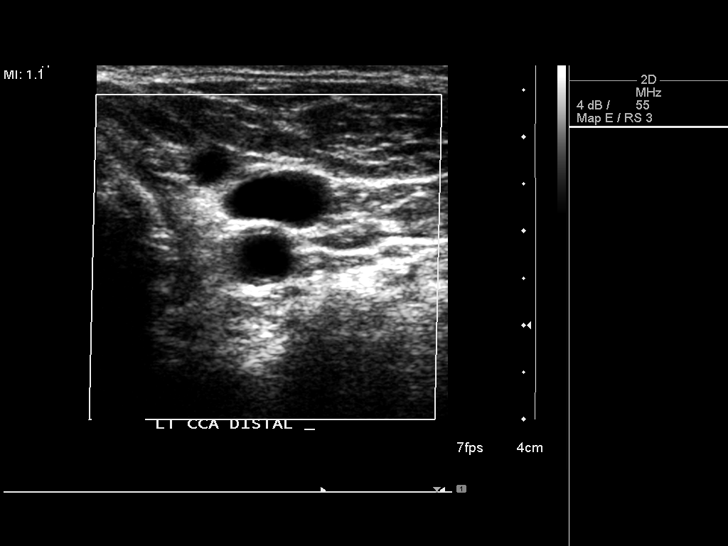
[im 39/69]
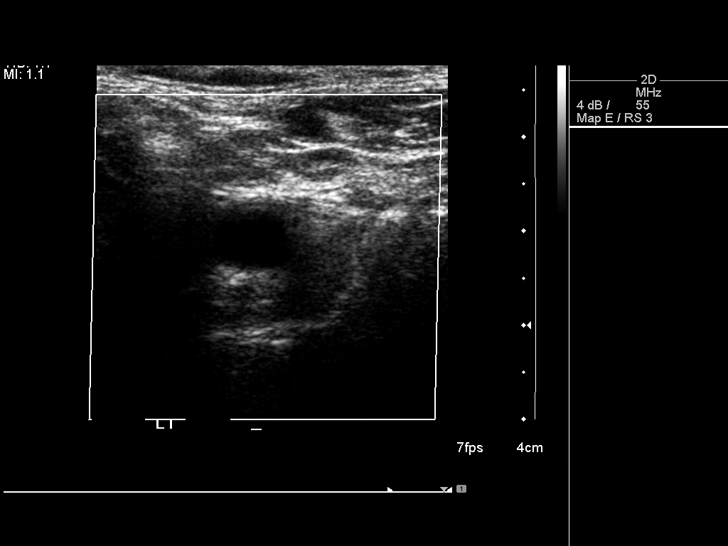
[im 45/69]
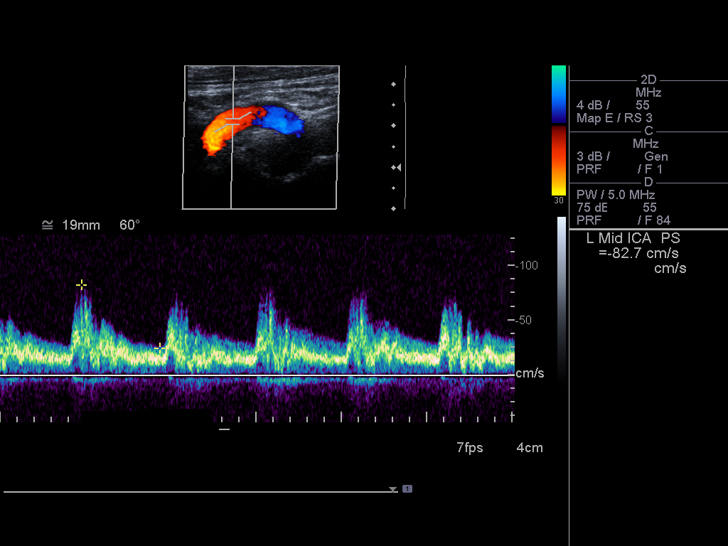
[im 51/69]
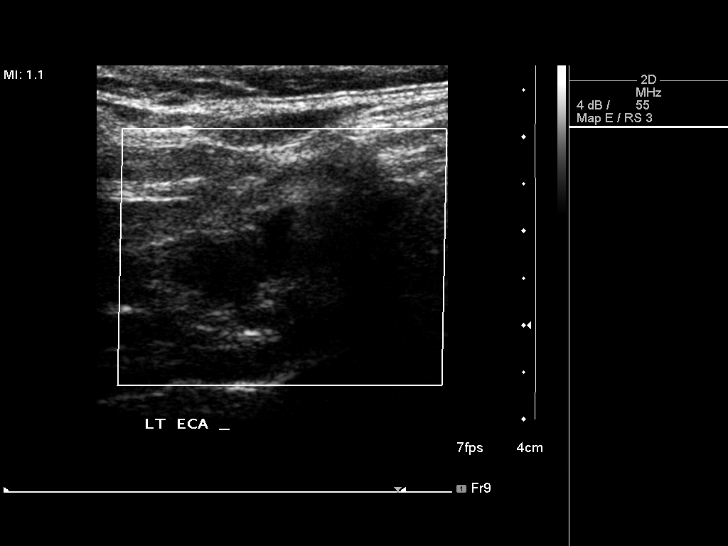
[im 57/69]
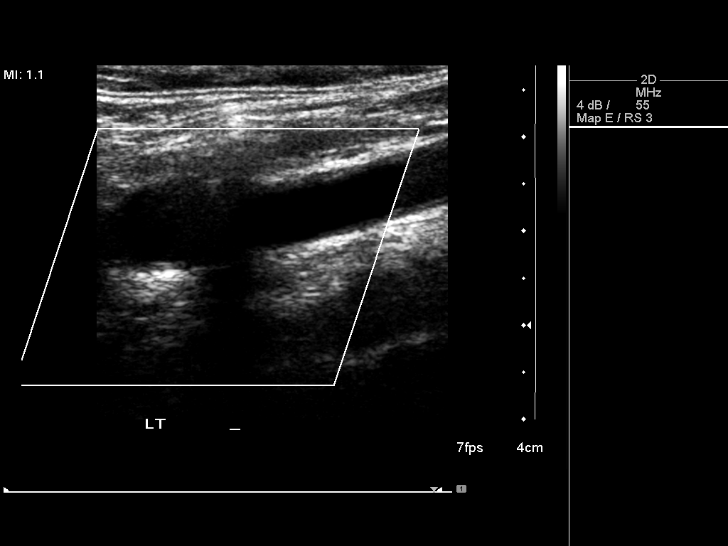
[im 63/69]
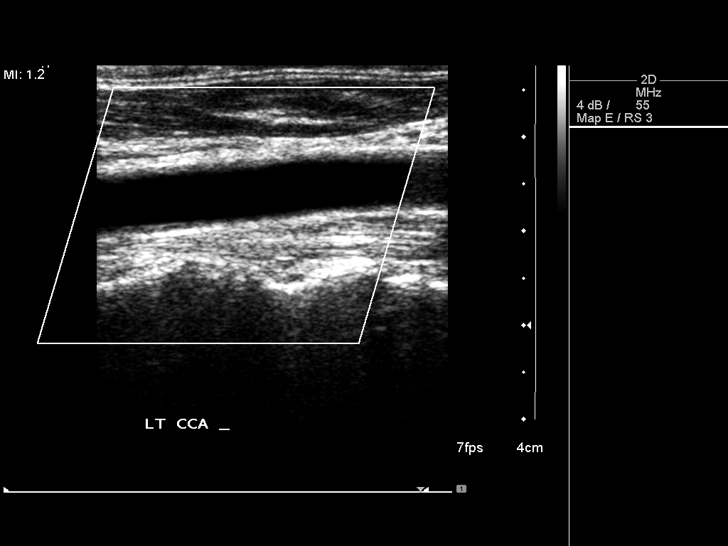
[im 69/69]
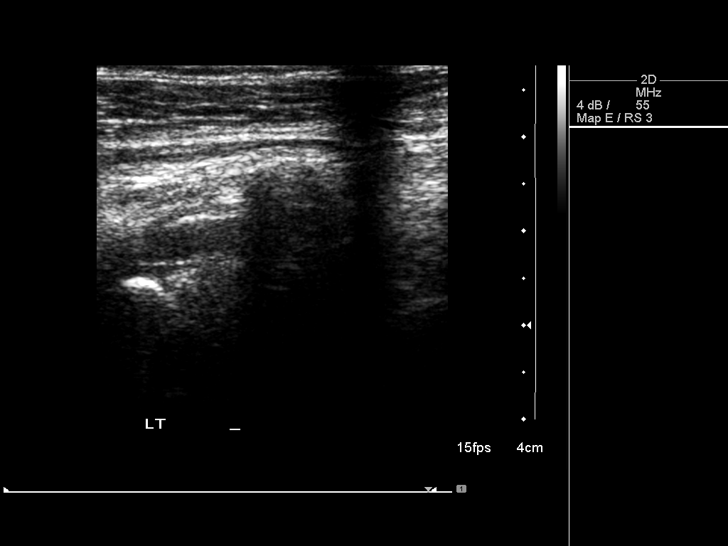

[13 of 24 positions shown; findings below may reference images not displayed]

FINDINGS: Criteria: Quantification of carotid stenosis is based on velocity
parameters that correlate the residual internal carotid diameter
with NASCET-based stenosis levels, using the diameter of the distal
internal carotid lumen as the denominator for stenosis measurement.

The following velocity measurements were obtained:

RIGHT

ICA:  60 cm/sec

CCA:  103 cm/sec

SYSTOLIC ICA/CCA RATIO:

DIASTOLIC ICA/CCA RATIO:

ECA:  73 cm/sec

LEFT

ICA:  102 cm/sec

CCA:  131 cm/sec

SYSTOLIC ICA/CCA RATIO:

DIASTOLIC ICA/CCA RATIO:

ECA:  65 cm/sec

RIGHT CAROTID ARTERY: Right carotid arteries are patent without
significant plaque or stenosis. Normal waveforms and velocities in
the right internal carotid artery.

RIGHT VERTEBRAL ARTERY: Antegrade flow and normal waveform in the
right vertebral artery.

LEFT CAROTID ARTERY: Left carotid arteries are patent without
significant plaque or stenosis. Normal waveforms and velocities in
left internal carotid artery.

LEFT VERTEBRAL ARTERY: Antegrade flow and normal waveform in the
left vertebral artery.
IMPRESSION: Normal carotid artery examination. No significant carotid artery
stenosis or plaque.

## 2016-12-06 DIAGNOSIS — E7800 Pure hypercholesterolemia, unspecified: Secondary | ICD-10-CM | POA: Diagnosis not present

## 2016-12-06 DIAGNOSIS — D179 Benign lipomatous neoplasm, unspecified: Secondary | ICD-10-CM | POA: Diagnosis not present

## 2016-12-06 DIAGNOSIS — M545 Low back pain: Secondary | ICD-10-CM | POA: Diagnosis not present

## 2016-12-06 DIAGNOSIS — Z0000 Encounter for general adult medical examination without abnormal findings: Secondary | ICD-10-CM | POA: Diagnosis not present

## 2016-12-06 DIAGNOSIS — N529 Male erectile dysfunction, unspecified: Secondary | ICD-10-CM | POA: Diagnosis not present

## 2016-12-06 DIAGNOSIS — Z125 Encounter for screening for malignant neoplasm of prostate: Secondary | ICD-10-CM | POA: Diagnosis not present

## 2016-12-06 DIAGNOSIS — Z0001 Encounter for general adult medical examination with abnormal findings: Secondary | ICD-10-CM | POA: Diagnosis not present

## 2016-12-06 DIAGNOSIS — D489 Neoplasm of uncertain behavior, unspecified: Secondary | ICD-10-CM | POA: Diagnosis not present

## 2016-12-06 DIAGNOSIS — I2510 Atherosclerotic heart disease of native coronary artery without angina pectoris: Secondary | ICD-10-CM | POA: Diagnosis not present

## 2017-04-09 DIAGNOSIS — H02831 Dermatochalasis of right upper eyelid: Secondary | ICD-10-CM | POA: Diagnosis not present

## 2017-04-09 DIAGNOSIS — H02834 Dermatochalasis of left upper eyelid: Secondary | ICD-10-CM | POA: Diagnosis not present

## 2017-04-09 DIAGNOSIS — H2513 Age-related nuclear cataract, bilateral: Secondary | ICD-10-CM | POA: Diagnosis not present

## 2017-04-09 DIAGNOSIS — H52209 Unspecified astigmatism, unspecified eye: Secondary | ICD-10-CM | POA: Diagnosis not present

## 2017-05-28 DIAGNOSIS — K625 Hemorrhage of anus and rectum: Secondary | ICD-10-CM | POA: Diagnosis not present

## 2017-05-28 DIAGNOSIS — K644 Residual hemorrhoidal skin tags: Secondary | ICD-10-CM | POA: Diagnosis not present

## 2017-09-17 DIAGNOSIS — K649 Unspecified hemorrhoids: Secondary | ICD-10-CM | POA: Diagnosis not present

## 2017-09-17 DIAGNOSIS — R040 Epistaxis: Secondary | ICD-10-CM | POA: Diagnosis not present

## 2017-09-17 DIAGNOSIS — G5622 Lesion of ulnar nerve, left upper limb: Secondary | ICD-10-CM | POA: Diagnosis not present

## 2017-09-28 ENCOUNTER — Other Ambulatory Visit: Payer: Self-pay | Admitting: General Surgery

## 2017-09-28 ENCOUNTER — Ambulatory Visit
Admission: RE | Admit: 2017-09-28 | Discharge: 2017-09-28 | Disposition: A | Discharge status: Home / Self Care | Payer: BLUE CROSS/BLUE SHIELD | Source: Ambulatory Visit | Attending: General Surgery | Admitting: General Surgery

## 2017-09-28 DIAGNOSIS — K623 Rectal prolapse: Secondary | ICD-10-CM | POA: Diagnosis not present

## 2017-09-28 DIAGNOSIS — R5383 Other fatigue: Secondary | ICD-10-CM | POA: Diagnosis not present

## 2017-09-28 DIAGNOSIS — R040 Epistaxis: Secondary | ICD-10-CM

## 2017-10-03 DIAGNOSIS — Z23 Encounter for immunization: Secondary | ICD-10-CM | POA: Diagnosis not present

## 2017-10-09 DIAGNOSIS — K649 Unspecified hemorrhoids: Secondary | ICD-10-CM | POA: Diagnosis not present

## 2017-10-09 DIAGNOSIS — K625 Hemorrhage of anus and rectum: Secondary | ICD-10-CM | POA: Diagnosis not present

## 2017-10-29 ENCOUNTER — Ambulatory Visit: Payer: Self-pay | Admitting: Surgery

## 2017-10-29 NOTE — H&P (Signed)
CC: Referred by my partner, Dr. Kieth Harrison, for possible rectal prolapse  HPI: [name] is a very pleasant 72 year old male with hx of seasonal allergies, CAD (s/p stent 1999), HLD, ED - presents as a referral by Dr. Kieth Harrison with a history of intermittent bleeding from his anus and tissue prolapse which he first noticed 03/2017. He was told by his PCP he had hemorrhoids and after having persistent issues was referred here. He states he has a bowel movement every day to every other day. He notes some discomfort with bowel movements but no pain between. It soft and firm but sometimes soft. He is not taking a daily fiber supplement. He drinks less than 64 ounces of water per day. He spends between 5 and 10 minutes on the commode with a bowel movement. Following a bowel movement with whatever prolapses out, he does note that he is able to successfully pushed back up himself. He is also noted multiple nosebleeds. He takes an aspirin. He has had unintentional weight loss of 8 pounds last 4 months.  He had a colonoscopy 02/08/2016 which was normal   PMH: Allergies (controlled with avoidance), HLD (well-controlled with statin), CAD (status post stent in 1999), ED (PRN viagra)  PSH: He denies any prior abdominal or anorectal procedures in the past.  FHx: Denies FHx of colorectal cancer; does note family history of prostate cancer  Social: Denies use of tobacco/EtOH/drugs; partially retired-Works with [employer]  ROS: A comprehensive 10 system review of systems was completed with the patient and pertinent findings as noted above.  The patient is a 72 year old male.   Medication History (Steven Harrison, CMA; 10/09/2017 11:00 AM) Ocuvite-Lutein (Oral) Active. Atorvastatin Calcium (20MG  Tablet, Oral) Active. Sildenafil Citrate (100MG  Tablet, Oral) Active. Medications Reconciled    Review of Systems Steven Gave M. Breckon Reeves MD; 10/09/2017 11:43 AM) General Present- Fatigue.  Not Present- Appetite Loss, Chills, Fever, Night Sweats, Weight Gain and Weight Loss. Skin Not Present- Change in Wart/Mole, Dryness, Hives, Jaundice, New Lesions, Non-Healing Wounds, Rash and Ulcer. HEENT Present- Nose Bleed and Wears glasses/contact lenses. Not Present- Earache, Hearing Loss, Hoarseness, Oral Ulcers, Ringing in the Ears, Seasonal Allergies, Sinus Pain, Sore Throat, Visual Disturbances and Yellow Eyes. Respiratory Not Present- Bloody sputum, Chronic Cough, Difficulty Breathing, Snoring and Wheezing. Cardiovascular Not Present- Chest Pain, Difficulty Breathing Lying Down, Leg Cramps, Palpitations, Rapid Heart Rate, Shortness of Breath and Swelling of Extremities. Gastrointestinal Present- Hemorrhoids and Rectal Pain. Not Present- Abdominal Pain, Bloating, Bloody Stool, Change in Bowel Habits, Chronic diarrhea, Constipation, Difficulty Swallowing, Excessive gas, Gets full quickly at meals, Indigestion, Nausea and Vomiting. Male Genitourinary Present- Frequency and Nocturia. Not Present- Blood in Urine, Change in Urinary Stream, Impotence, Painful Urination, Urgency and Urine Leakage. Musculoskeletal Present- Back Pain. Not Present- Joint Pain, Joint Stiffness, Muscle Pain, Muscle Weakness and Swelling of Extremities. Neurological Present- Headaches and Weakness. Not Present- Decreased Memory, Fainting, Numbness, Seizures, Tingling, Tremor and Trouble walking. Psychiatric Not Present- Anxiety, Bipolar, Change in Sleep Pattern, Depression, Fearful and Frequent crying. Endocrine Not Present- Cold Intolerance, Excessive Hunger, Hair Changes, Heat Intolerance and New Diabetes. Hematology Not Present- Easy Bruising, Excessive bleeding, Gland problems, HIV and Persistent Infections.  Vitals (Steven Harrison CMA; 10/09/2017 10:59 AM) 10/09/2017 10:59 AM Weight: 190.13 lb Height: 69in Body Surface Area: 2.02 m Body Mass Index: 28.08 kg/m  Temp.: 97.51F  Pulse: 69 (Regular)  P.OX:  96% (Room air) BP: 122/64 (Sitting, Left Arm, Standard)       Physical Exam (  Steven Mt. Rainie Crenshaw MD; 10/09/2017 11:53 AM) The physical exam findings are as follows: Note:Constitutional: No acute distress; conversant; no deformities Eyes: Moist conjunctiva; no lid lag; anicteric sclerae; pupils equal round and reactive to light Neck: Trachea midline; no palpable thyromegaly Lungs: Normal respiratory effort; no tactile fremitus CV: Regular rate and rhythm; no palpable thrill; no pitting edema GI: Abdomen soft, nontender, nondistended; no palpable hepatosplenomegaly Anorectal: Small external skin tag; DRE-no palpable masses; good tone. Commode exam-prolapsing internal hemorrhoids (radial); no evidence of rectal prolapse (concentric rings) Anoscopy: 3 column internal hemorrhoids MSK: Normal gait; no clubbing/cyanosis Psychiatric: Appropriate affect; alert and oriented 3    Assessment & Plan Steven Gave M. Jacque Byron MD; 10/09/2017 11:54 AM) HEMORRHOID (K64.9) Story: [name] is a very pleasant 73yoM with rectal bleeding, grade III internal hemorrhoids, weight loss Impression: -We discussed the anatomy and physiology of the GI tract and pathophysiology of hemorrhoids with associated illustrations. I recommended he begin taking a daily fiber supplement (Metamucil/Citrucel/FiberCon/Benefiber/Konsyl), drinks 64 ounces of water per day (8, 8 ounce glasses) and minimize time on commode 2-3 minutes. -Given rectal bleeding and weight loss, will also plan to repeat his colonoscopy -We discussed additional treatment options for his hemorrhoids should the above fail after trial for 3 months Current Plans ANOSCOPY, DIAGNOSTIC (11914) (Anorectal: Small external skin tag; DRE-no palpable masses; good tone. Commode exam-prolapsing internal hemorrhoids (radial); no evidence of rectal prolapse (concentric rings); Anoscopy: 3 column internal hemorrhoids)  RECTAL BLEEDING (K62.5)  -Given rectal bleeding  and weight loss, will also plan to repeat his colonoscopy -The anatomy and physiology of the anal canal was discussed at length with the patient. The pathophysiology of colon polyps and malignancy was discussed at length as well -The planned procedure, material risks (including, but not limited to, perforation of colon, post-polypectomy bleeding, need for blood transfusion, need for additional procedures) benefits and alternatives to the procedure were discussed at length. The patient's questions were answered to his satisfaction, he voiced understanding and elected to proceed with the procedure. Additionally, we discussed typical postoperative expectations and the recovery process.  Signed electronically by Ileana Roup, MD (10/09/2017 11:54 AM)

## 2017-12-12 ENCOUNTER — Encounter (HOSPITAL_COMMUNITY): Payer: Self-pay | Admitting: *Deleted

## 2017-12-12 ENCOUNTER — Other Ambulatory Visit: Payer: Self-pay

## 2017-12-12 NOTE — Anesthesia Preprocedure Evaluation (Addendum)
Anesthesia Evaluation  Patient identified by MRN, date of birth, ID band Patient awake    Reviewed: Allergy & Precautions, NPO status , Patient's Chart, lab work & pertinent test results  History of Anesthesia Complications Negative for: history of anesthetic complications  Airway Mallampati: II  TM Distance: >3 FB Neck ROM: Full    Dental  (+) Dental Advisory Given, Caps   Pulmonary    breath sounds clear to auscultation       Cardiovascular (-) angina+ CAD and + Cardiac Stents   Rhythm:Regular Rate:Normal     Neuro/Psych negative neurological ROS     GI/Hepatic negative GI ROS, Neg liver ROS,   Endo/Other  negative endocrine ROS  Renal/GU negative Renal ROS     Musculoskeletal   Abdominal   Peds  Hematology negative hematology ROS (+)   Anesthesia Other Findings   Reproductive/Obstetrics                            Anesthesia Physical Anesthesia Plan  ASA: III  Anesthesia Plan: MAC   Post-op Pain Management:    Induction:   PONV Risk Score and Plan: 1 and Treatment may vary due to age or medical condition  Airway Management Planned: Natural Airway and Nasal Cannula  Additional Equipment:   Intra-op Plan:   Post-operative Plan:   Informed Consent: I have reviewed the patients History and Physical, chart, labs and discussed the procedure including the risks, benefits and alternatives for the proposed anesthesia with the patient or authorized representative who has indicated his/her understanding and acceptance.   Dental advisory given  Plan Discussed with: CRNA and Surgeon  Anesthesia Plan Comments:         Anesthesia Quick Evaluation

## 2017-12-13 ENCOUNTER — Ambulatory Visit (HOSPITAL_COMMUNITY): Payer: BLUE CROSS/BLUE SHIELD | Admitting: Anesthesiology

## 2017-12-13 ENCOUNTER — Encounter (HOSPITAL_COMMUNITY): Payer: Self-pay

## 2017-12-13 ENCOUNTER — Encounter (HOSPITAL_COMMUNITY)
Admission: RE | Disposition: A | Discharge status: Home / Self Care | Payer: Self-pay | Source: Ambulatory Visit | Attending: Surgery

## 2017-12-13 ENCOUNTER — Other Ambulatory Visit: Payer: Self-pay

## 2017-12-13 ENCOUNTER — Ambulatory Visit (HOSPITAL_COMMUNITY)
Admission: RE | Admit: 2017-12-13 | Discharge: 2017-12-13 | Disposition: A | Discharge status: Home / Self Care | Payer: BLUE CROSS/BLUE SHIELD | Source: Ambulatory Visit | Attending: Surgery | Admitting: Surgery

## 2017-12-13 DIAGNOSIS — R634 Abnormal weight loss: Secondary | ICD-10-CM | POA: Diagnosis not present

## 2017-12-13 DIAGNOSIS — Z955 Presence of coronary angioplasty implant and graft: Secondary | ICD-10-CM | POA: Insufficient documentation

## 2017-12-13 DIAGNOSIS — Z79899 Other long term (current) drug therapy: Secondary | ICD-10-CM | POA: Insufficient documentation

## 2017-12-13 DIAGNOSIS — K649 Unspecified hemorrhoids: Secondary | ICD-10-CM | POA: Diagnosis not present

## 2017-12-13 DIAGNOSIS — D123 Benign neoplasm of transverse colon: Secondary | ICD-10-CM | POA: Diagnosis not present

## 2017-12-13 DIAGNOSIS — Z7982 Long term (current) use of aspirin: Secondary | ICD-10-CM | POA: Diagnosis not present

## 2017-12-13 DIAGNOSIS — E785 Hyperlipidemia, unspecified: Secondary | ICD-10-CM | POA: Insufficient documentation

## 2017-12-13 DIAGNOSIS — K5730 Diverticulosis of large intestine without perforation or abscess without bleeding: Secondary | ICD-10-CM | POA: Insufficient documentation

## 2017-12-13 DIAGNOSIS — N529 Male erectile dysfunction, unspecified: Secondary | ICD-10-CM | POA: Diagnosis not present

## 2017-12-13 DIAGNOSIS — K642 Third degree hemorrhoids: Secondary | ICD-10-CM | POA: Diagnosis not present

## 2017-12-13 DIAGNOSIS — K625 Hemorrhage of anus and rectum: Secondary | ICD-10-CM | POA: Insufficient documentation

## 2017-12-13 DIAGNOSIS — I2510 Atherosclerotic heart disease of native coronary artery without angina pectoris: Secondary | ICD-10-CM | POA: Diagnosis not present

## 2017-12-13 DIAGNOSIS — K635 Polyp of colon: Secondary | ICD-10-CM | POA: Diagnosis not present

## 2017-12-13 HISTORY — PX: COLONOSCOPY: SHX5424

## 2017-12-13 HISTORY — DX: Atherosclerotic heart disease of native coronary artery without angina pectoris: I25.10

## 2017-12-13 HISTORY — PX: POLYPECTOMY: SHX5525

## 2017-12-13 SURGERY — COLONOSCOPY
Anesthesia: Monitor Anesthesia Care

## 2017-12-13 MED ORDER — PROMETHAZINE HCL 25 MG/ML IJ SOLN: 6.2500 mg | INTRAMUSCULAR | Status: DC | PRN

## 2017-12-13 MED ORDER — PROPOFOL 10 MG/ML IV BOLUS
INTRAVENOUS | Status: DC | PRN
  Administered 2017-12-13: 20 mg via INTRAVENOUS

## 2017-12-13 MED ORDER — PROPOFOL 10 MG/ML IV BOLUS
INTRAVENOUS | Status: AC
  Filled 2017-12-13: qty 20

## 2017-12-13 MED ORDER — PROPOFOL 10 MG/ML IV BOLUS
INTRAVENOUS | Status: AC
  Filled 2017-12-13: qty 40

## 2017-12-13 MED ORDER — SODIUM CHLORIDE 0.9 % IV SOLN: INTRAVENOUS | Status: DC

## 2017-12-13 MED ORDER — LACTATED RINGERS IV SOLN
INTRAVENOUS | Status: DC
  Administered 2017-12-13: 07:00:00 via INTRAVENOUS

## 2017-12-13 MED ORDER — PROPOFOL 500 MG/50ML IV EMUL
INTRAVENOUS | Status: DC | PRN
  Administered 2017-12-13: 125 ug/kg/min via INTRAVENOUS

## 2017-12-13 MED ORDER — MIDAZOLAM HCL 2 MG/2ML IJ SOLN: 0.5000 mg | Freq: Once | INTRAMUSCULAR | Status: DC | PRN

## 2017-12-13 MED ORDER — MEPERIDINE HCL 25 MG/ML IJ SOLN: 6.2500 mg | INTRAMUSCULAR | Status: DC | PRN

## 2017-12-13 NOTE — Transfer of Care (Signed)
Immediate Anesthesia Transfer of Care Note  Patient: Steven Harrison  Procedure(s) Performed: COLONOSCOPY (N/A ) POLYPECTOMY  Patient Location: PACU  Anesthesia Type:MAC  Level of Consciousness: sedated  Airway & Oxygen Therapy: Patient Spontanous Breathing and Patient connected to face mask oxygen  Post-op Assessment: Report given to RN and Post -op Vital signs reviewed and stable  Post vital signs: Reviewed and stable  Last Vitals:  Vitals Value Taken Time  BP    Temp    Pulse    Resp    SpO2      Last Pain:  Vitals:   12/13/17 7416  TempSrc: Oral  PainSc: 0-No pain         Complications: No apparent anesthesia complications

## 2017-12-13 NOTE — Anesthesia Postprocedure Evaluation (Signed)
Anesthesia Post Note  Patient: Steven Harrison  Procedure(s) Performed: COLONOSCOPY (N/A ) POLYPECTOMY     Patient location during evaluation: Endoscopy Anesthesia Type: MAC Level of consciousness: awake and alert, oriented and patient cooperative Pain management: pain level controlled Vital Signs Assessment: post-procedure vital signs reviewed and stable Respiratory status: spontaneous breathing, nonlabored ventilation and respiratory function stable Cardiovascular status: blood pressure returned to baseline and stable Postop Assessment: no apparent nausea or vomiting and able to ambulate Anesthetic complications: no    Last Vitals:  Vitals:   12/13/17 0820 12/13/17 0830  BP: 117/76 (!) 99/58  Pulse: 74 61  Resp: 12 15  Temp:    SpO2: 99% 99%    Last Pain:  Vitals:   12/13/17 0830  TempSrc:   PainSc: 0-No pain                 Temple Sporer,E. Nabria Nevin

## 2017-12-13 NOTE — Discharge Instructions (Signed)
Please call Dr. Orest Dikes office if you have any questions or concerns  [phone]).

## 2017-12-13 NOTE — Op Note (Signed)
[facility] Patient Name: Steven Harrison Procedure Date: 12/13/2017 MRN: [id number] Attending MD: Ileana Roup MD, MD Date of Birth: Sep 08, 1945 CSN: [id number] Age: 72 Admit Type: Inpatient Procedure:                Colonoscopy Indications:              Rectal bleeding Providers:                Sharon Mt. Mirella Gueye MD, MD, Baird Cancer, RN,                            Alan Mulder, Technician, Cicero Alday CRNA,                            CRNA Referring MD:              Medicines:                Monitored Anesthesia Care Complications:            No immediate complications. Estimated Blood Loss:     Estimated blood loss was minimal. Procedure:                Pre-Anesthesia Assessment:                           - Prior to the procedure, a History and Physical                            was performed, and patient medications, allergies                            and sensitivities were reviewed. The patient's                            tolerance of previous anesthesia was reviewed.                           - The risks and benefits of the procedure and the                            sedation options and risks were discussed with the                            patient. All questions were answered and informed                            consent was obtained.                           - Patient identification and proposed procedure                            were verified prior to the procedure by the                            physician, the nurse and the anesthetist. The  procedure was verified in the pre-procedure area in                            the endoscopy suite.                           - ASA Grade Assessment: II - A patient with mild                            systemic disease.                           After obtaining informed consent, the colonoscope                            was passed under direct vision. Throughout the                             procedure, the patient's blood pressure, pulse, and                            oxygen saturations were monitored continuously. The                            CF-HQ190L ([id number] Olympus adult colonoscope was                            introduced through the anus and advanced to the the                            cecum, identified by appendiceal orifice and                            ileocecal valve. The colonoscopy was performed                            without difficulty. The patient tolerated the                            procedure well. The quality of the bowel                            preparation was adequate. Scope In: 7:38:33 AM Scope Out: 8:09:10 AM Scope Withdrawal Time: 0 hours 13 minutes 27 seconds  Total Procedure Duration: 0 hours 30 minutes 37 seconds  Findings:      Hemorrhoids were found on perianal exam.      A 3 mm polyp was found in the splenic flexure. The polyp was       semi-sessile. The polyp was removed with a cold snare. Resection and       retrieval were complete. Estimated blood loss was minimal.      Multiple medium-mouthed diverticula were found in the sigmoid colon.      The exam was otherwise without abnormality on direct and retroflexion       views. Impression:               -  Hemorrhoids found on perianal exam.                           - One 3 mm polyp at the splenic flexure, removed                            with a cold snare. Resected and retrieved.                           - Diverticulosis in the sigmoid colon.                           - The examination was otherwise normal on direct                            and retroflexion views. Moderate Sedation:      Not Applicable - Patient had care per Anesthesia. Recommendation:           - Discharge patient to home.                           - High fiber diet indefinitely.                           - No aspirin, ibuprofen, naproxen, or other                             non-steroidal anti-inflammatory drugs for 7 days                            after polyp removal.                           - Await pathology results.                           - Repeat colonoscopy date to be determined after                            pending pathology results are reviewed for                            surveillance based on pathology results.                           - Return to primary care physician at appointment                            to be scheduled. Procedure Code(s):        --- Professional ---                           [phone], Colonoscopy, flexible; with removal of                            tumor(s), polyp(s), or other lesion(s) by  snare                            technique Diagnosis Code(s):        --- Professional ---                           D12.3, Benign neoplasm of transverse colon (hepatic                            flexure or splenic flexure)                           K64.9, Unspecified hemorrhoids                           K62.5, Hemorrhage of anus and rectum                           K57.30, Diverticulosis of large intestine without                            perforation or abscess without bleeding CPT copyright 2018 American Medical Association. All rights reserved. The codes documented in this report are preliminary and upon coder review may  be revised to meet current compliance requirements. Nadeen Landau, MD Ileana Roup MD, MD 12/13/2017 8:18:41 AM This report has been signed electronically. Number of Addenda: 0

## 2017-12-13 NOTE — H&P (Signed)
CC: Here today for colonoscopy  HPI: [name] is a very pleasant 72 year old male with hx of seasonal allergies, CAD (s/p stent 1999), HLD, ED - presents as a referral by Dr. Kieth Harrison with a history of intermittent bleeding from his anus and tissue prolapse which he first noticed 03/2017. He was told by his PCP he had hemorrhoids and after having persistent issues was referred here. He states he has a bowel movement every day to every other day. He notes some discomfort with bowel movements but no pain between. It soft and firm but sometimes soft. He is not taking a daily fiber supplement. He drinks less than 64 ounces of water per day. He spends between 5 and 10 minutes on the commode with a bowel movement. Following a bowel movement with whatever prolapses out, he does note that he is able to successfully pushed back up himself. He is also noted multiple nosebleeds. He takes an aspirin. He has had unintentional weight loss of 8 pounds last 4 months.  He had a colonoscopy 02/08/2016 which was reported normal but none of the above became an issue until more recently  PMH: Allergies (controlled with avoidance), HLD (well-controlled with statin), CAD (status post stent in 1999), ED (PRN viagra)  PSH: He denies any prior abdominal or anorectal procedures in the past.  FHx: Denies FHx of colorectal cancer; does note family history of prostate cancer  Social: Denies use of tobacco/EtOH/drugs; partially retired-Works with [employer]  ROS: A comprehensive 10 system review of systems was completed with the patient and pertinent findings as noted above.   Past Medical History:  Diagnosis Date  . Coronary artery disease    coronary stent - 1999 - released by carsdiologist several years ago per patient     Past Surgical History:  Procedure Laterality Date  . COLONOSCOPY WITH PROPOFOL N/A 02/08/2016   Procedure: COLONOSCOPY WITH PROPOFOL;  Surgeon: Garlan Fair,  MD;  Location: [facility];  Service: Endoscopy;  Laterality: N/A;  . NO PAST SURGERIES     Patient had a stent palced in 1999    History reviewed. No pertinent family history.  Social:  reports that he has never smoked. He has never used smokeless tobacco. He reports that he does not drink alcohol or use drugs.  Allergies: No Known Allergies  Medications: I have reviewed the patient's current medications.  No results found for this or any previous visit (from the past 48 hour(s)).  No results found.  ROS - all of the below systems have been reviewed with the patient and positives are indicated with bold text General: chills, fever or night sweats Eyes: blurry vision or double vision ENT: epistaxis or sore throat Allergy/Immunology: itchy/watery eyes or nasal congestion Hematologic/Lymphatic: bleeding problems, blood clots or swollen lymph nodes Endocrine: temperature intolerance or unexpected weight changes Breast: new or changing breast lumps or nipple discharge Resp: cough, shortness of breath, or wheezing CV: chest pain or dyspnea on exertion GI: as per HPI GU: dysuria, trouble voiding, or hematuria MSK: joint pain or joint stiffness Neuro: TIA or stroke symptoms Derm: pruritus and skin lesion changes Psych: anxiety and depression  PE Blood pressure 120/69, pulse 67, temperature 97.8 F (36.6 C), temperature source Oral, resp. rate 12, height 5\' 9"  (1.753 m), weight 84.4 kg, SpO2 98 %. Constitutional: NAD; conversant; no deformities Eyes: Moist conjunctiva; no lid lag; anicteric; PERRL Neck: Trachea midline; no thyromegaly Lungs: Normal respiratory effort; no tactile fremitus CV: RRR; no palpable  thrills; no pitting edema GI: Abd soft, NT/ND; no palpable hepatosplenomegaly MSK: Normal gait; no clubbing/cyanosis Psychiatric: Appropriate affect; alert and oriented x3 Lymphatic: No palpable cervical or axillary lymphadenopathy   A/P: [name] is a very pleasant  72yoM with rectal bleeding, grade III internal hemorrhoids, weight loss  -Given rectal bleeding and weight loss, will also plan to repeat his colonoscopy -The anatomy and physiology of the anal canal was discussed at length with the patient. The pathophysiology of colon polyps and malignancy was discussed at length as well -The planned procedure, material risks (including, but not limited to, perforation of colon, post-polypectomy bleeding, need for blood transfusion, need for additional procedures) benefits and alternatives to the procedure were discussed at length. The patient's questions were answered to his satisfaction, he voiced understanding and elected to proceed with the procedure. Additionally, we discussed typical postoperative expectations and the recovery process.  Sharon Mt. Dema Severin, M.D. [facility]

## 2017-12-14 ENCOUNTER — Encounter (HOSPITAL_COMMUNITY): Payer: Self-pay | Admitting: Surgery

## 2017-12-24 DIAGNOSIS — K649 Unspecified hemorrhoids: Secondary | ICD-10-CM | POA: Diagnosis not present

## 2018-02-20 DIAGNOSIS — K602 Anal fissure, unspecified: Secondary | ICD-10-CM | POA: Diagnosis not present

## 2018-02-20 DIAGNOSIS — K649 Unspecified hemorrhoids: Secondary | ICD-10-CM | POA: Diagnosis not present

## 2018-02-26 DIAGNOSIS — N529 Male erectile dysfunction, unspecified: Secondary | ICD-10-CM | POA: Diagnosis not present

## 2018-02-26 DIAGNOSIS — Z0001 Encounter for general adult medical examination with abnormal findings: Secondary | ICD-10-CM | POA: Diagnosis not present

## 2018-02-26 DIAGNOSIS — E7800 Pure hypercholesterolemia, unspecified: Secondary | ICD-10-CM | POA: Diagnosis not present

## 2018-02-26 DIAGNOSIS — Z9229 Personal history of other drug therapy: Secondary | ICD-10-CM | POA: Diagnosis not present

## 2018-02-26 DIAGNOSIS — D179 Benign lipomatous neoplasm, unspecified: Secondary | ICD-10-CM | POA: Diagnosis not present

## 2018-02-26 DIAGNOSIS — Z13220 Encounter for screening for lipoid disorders: Secondary | ICD-10-CM | POA: Diagnosis not present

## 2018-02-26 DIAGNOSIS — I2510 Atherosclerotic heart disease of native coronary artery without angina pectoris: Secondary | ICD-10-CM | POA: Diagnosis not present

## 2018-02-26 DIAGNOSIS — Z23 Encounter for immunization: Secondary | ICD-10-CM | POA: Diagnosis not present

## 2018-02-26 DIAGNOSIS — Z1159 Encounter for screening for other viral diseases: Secondary | ICD-10-CM | POA: Diagnosis not present

## 2018-04-26 MED FILL — ATORVASTATIN 20 MG TABLET: 20 | 90 days supply | Qty: 90 | Fill #0

## 2018-04-26 MED FILL — SILDENAFIL CITRATE 100 MG T: 100 | 30 days supply | Qty: 4 | Fill #0

## 2018-05-16 DIAGNOSIS — H02834 Dermatochalasis of left upper eyelid: Secondary | ICD-10-CM | POA: Diagnosis not present

## 2018-05-16 DIAGNOSIS — H52209 Unspecified astigmatism, unspecified eye: Secondary | ICD-10-CM | POA: Diagnosis not present

## 2018-05-16 DIAGNOSIS — H02831 Dermatochalasis of right upper eyelid: Secondary | ICD-10-CM | POA: Diagnosis not present

## 2018-05-16 DIAGNOSIS — H2513 Age-related nuclear cataract, bilateral: Secondary | ICD-10-CM | POA: Diagnosis not present

## 2018-05-27 MED FILL — SILDENAFIL CITRATE 100 MG T: 100 | 30 days supply | Qty: 4 | Fill #1

## 2018-05-30 DIAGNOSIS — Z23 Encounter for immunization: Secondary | ICD-10-CM | POA: Diagnosis not present

## 2018-06-11 ENCOUNTER — Ambulatory Visit: Payer: Self-pay | Admitting: Surgery

## 2018-06-11 NOTE — H&P (Signed)
CC: Referred by my partner, Dr. Kieth Brightly, for possible rectal prolapse  HPI: Steven Harrison is a very pleasant 73 year old male with hx of seasonal allergies, CAD (s/p stent 1999), HLD, ED - presents as a referral by Dr. Kieth Brightly with a history of intermittent bleeding from his anus and tissue prolapse which he first noticed 03/2017. He was told by his PCP he had hemorrhoids and after having persistent issues was referred here. He states he has a bowel movement every day to every other day. He notes some discomfort with bowel movements but no pain between. It soft and firm but sometimes soft. He is not taking a daily fiber supplement. He drinks less than 64 ounces of water per day. He spends between 5 and 10 minutes on the commode with a bowel movement. Following a bowel movement with whatever prolapses out, he does note that he is able to successfully pushed back up himself. He is also noted multiple nosebleeds. He takes an aspirin.  Colonoscopy 02/08/2016 which was normal  We repeated his colonoscopy 12/13/17 which was notable for hemorrhoids and a 3 mm polyp at the splenic flexure. This was removed. Pathology returned tubular adenoma. This was discussed with him and recommendations for repeat colonoscopy in 5 years.  INTERVAL HX His symptoms had been improving but appear to be worsening now. He is taking a fiber supplement and when necessary MiraLAX. He reports having a bowel movement almost every day and are typically soft. There are last month or so he's had sharp knifelike sensation with stool and some associated bright red blood. The pain subsequently subsided during the ensuing 30 minutes. He is still having tissue prolapse with mucoid drainage this caused some hygiene related issues. He denies any incontinence to gas, liquid stool, or solid stool.   PMH: Allergies (controlled with avoidance), HLD (well-controlled with statin), CAD (status post stent in 1999), ED (PRN viagra)  PSH:  He denies any prior abdominal or anorectal procedures in the past.  FHx: Denies FHx of colorectal cancer; does note family history of prostate cancer  Social: Denies use of tobacco/EtOH/drugs; partially retired-Works with [employer]  ROS: A comprehensive 10 system review of systems was completed with the patient and pertinent findings as noted above.  The patient is a 73 year old male.   Allergies Steven Harrison, Steven Harrison; 02/20/2018 9:37 AM) No Known Allergies [02/20/2018]: Allergies Reconciled   Medication History Steven Harrison, Steven Harrison; 02/20/2018 9:37 AM) Ocuvite-Lutein (Oral) Active. Atorvastatin Calcium (20MG  Tablet, Oral) Active. Sildenafil Citrate (100MG  Tablet, Oral) Active. Medications Reconciled    Review of Systems Steven Gave M. Nancye Grumbine MD; 02/20/2018 9:53 AM) General Present- Fatigue. Not Present- Appetite Loss, Chills, Fever, Night Sweats, Weight Gain and Weight Loss. Skin Not Present- Change in Wart/Mole, Dryness, Hives, Jaundice, New Lesions, Non-Healing Wounds, Rash and Ulcer. HEENT Present- Nose Bleed and Wears glasses/contact lenses. Not Present- Earache, Hearing Loss, Hoarseness, Oral Ulcers, Ringing in the Ears, Seasonal Allergies, Sinus Pain, Sore Throat, Visual Disturbances and Yellow Eyes. Respiratory Not Present- Bloody sputum, Chronic Cough, Difficulty Breathing, Snoring and Wheezing. Cardiovascular Not Present- Chest Pain, Difficulty Breathing Lying Down, Leg Cramps, Palpitations, Rapid Heart Rate, Shortness of Breath and Swelling of Extremities. Gastrointestinal Present- Hemorrhoids and Rectal Pain. Not Present- Abdominal Pain, Bloating, Bloody Stool, Change in Bowel Habits, Chronic diarrhea, Constipation, Difficulty Swallowing, Excessive gas, Gets full quickly at meals, Indigestion, Nausea and Vomiting. Male Genitourinary Present- Frequency and Nocturia. Not Present- Blood in Urine, Change in Urinary Stream, Impotence, Painful Urination, Urgency  and Urine Leakage. Musculoskeletal Present- Back Pain. Not Present- Joint Pain, Joint Stiffness, Muscle Pain, Muscle Weakness and Swelling of Extremities. Neurological Present- Headaches and Weakness. Not Present- Decreased Memory, Fainting, Numbness, Seizures, Tingling, Tremor and Trouble walking. Psychiatric Not Present- Anxiety, Bipolar, Change in Sleep Pattern, Depression, Fearful and Frequent crying. Endocrine Not Present- Cold Intolerance, Excessive Hunger, Hair Changes, Heat Intolerance and New Diabetes. Hematology Not Present- Easy Bruising, Excessive bleeding, Gland problems, HIV and Persistent Infections.  Vitals Steven Harrison RMA; 02/20/2018 9:38 AM) 02/20/2018 9:37 AM Weight: 192.25 lb Height: 69in Body Surface Area: 2.03 m Body Mass Index: 28.39 kg/m  Temp.: 62F(Temporal)  Pulse: 88 (Regular)  P.OX: 96% (Room air) BP: 108/64 (Sitting, Left Arm, Standard)       Physical Exam Steven Gave M. Jaicob Dia MD; 02/20/2018 9:54 AM) The physical exam findings are as follows: Note:Constitutional: NAD; conversant; no deformities Eyes: Moist conjunctiva; no lid lag; anicteric sclerae; pupils equal round and reactive to light Neck: Trachea midline; no palpable thyromegaly Lungs: Normal respiratory effort; no tactile fremitus CV: RRR; no palpable thrill; no pitting edema GI: Abdomen soft, nontender, nondistended; no palpable hepatosplenomegaly Anorectal: Prolapsed internal hemorrhoids-right anterior and right posterior remain most significant. All tissue pink and viable. Some new potentially abnormal appearing overlying mucosa vs changes related to chronic hemorrhoid prolapse. MSK: Normal gait; no clubbing/cyanosis Psychiatric: Appropriate affect; alert and oriented 3 Lymphatic: No palpable cervical or axillary lymphadenopathy    Assessment & Plan Steven Gave M. Ithan Touhey MD; 02/20/2018 9:57 AM) HEMORRHOID (K64.9) Story: Mr. Heitz is a very pleasant 44yoM with rectal  bleeding, grade III/IV internal hemorrhoids; repeat colonoscopy 12/13/17 - small TA splenic flexure + hemorrhoids; no other significant findings. Possible posterior midline anal fissure as well Impression: -We discussed the anatomy and physiology of the GI tract and pathophysiology of hemorrhoids with associated illustrations -We discussed options going forward including further observation with risks of persistent symptoms as well as not having tissue biopsy of what could be abnormal anal canal mucosa.. Given the persistence of his symptoms and now some new potentially a normal-appearing anal canal mucosa overlying the prolapsing hemorrhoids (versus changes related to chronic prolapse of the hemorrhoids) we discussed proceeding with anorectal exam under anesthesia, possible hemorrhoidectomy. -It appears that he also may have an anal fissure in the posterior midline. We will plan for a course of topical diltiazem. We will avoid doing multiple procedures this go round given the potential for this fissure to heal with medical therapy alone as well as having concurrent hemorrhoidectomy procedure. -The anatomy and physiology of the anal canal was discussed at length with the patient. The pathophysiology of hemorrhoids and fissures was discussed at length with associated pictures and illustrations. -The planned procedure, material risks (including, but not limited to, pain, bleeding, infection including pelvic sepsis and resultant stoma/death, scarring, need for blood transfusion, damage to anal sphincter, incontinence of gas and/or stool, need for additional procedures, recurrence of hemorrhoidal issues, anal stenosis, pneumonia, heart attack, stroke, death) benefits and alternatives to surgery were discussed at length. The patient's questions were answered to his satisfaction, he voiced understanding and elected to proceed with surgery. Additionally, we discussed typical postoperative expectations and the recovery  process. ANAL FISSURE (K60.2) Impression: -Will prescribe topical diltiazem to be applied QID for 1 mo; if symptoms persist, apply for 1 additional month  Signed electronically by Ileana Roup, MD (02/20/2018 9:57 AM)

## 2018-06-22 ENCOUNTER — Other Ambulatory Visit (HOSPITAL_COMMUNITY)
Admission: RE | Admit: 2018-06-22 | Discharge: 2018-06-22 | Disposition: A | Discharge status: Home / Self Care | Payer: BC Managed Care – PPO | Source: Ambulatory Visit | Attending: Surgery | Admitting: Surgery

## 2018-06-22 DIAGNOSIS — Z1159 Encounter for screening for other viral diseases: Secondary | ICD-10-CM | POA: Insufficient documentation

## 2018-06-24 ENCOUNTER — Encounter (HOSPITAL_BASED_OUTPATIENT_CLINIC_OR_DEPARTMENT_OTHER): Payer: Self-pay | Admitting: Emergency Medicine

## 2018-06-24 ENCOUNTER — Other Ambulatory Visit: Payer: Self-pay

## 2018-06-24 LAB — NOVEL CORONAVIRUS, NAA (HOSP ORDER, SEND-OUT TO REF LAB; TAT 18-24 HRS): SARS-CoV-2, NAA: NOT DETECTED

## 2018-06-24 NOTE — Progress Notes (Signed)
SPOKE WITH: PATIENT ARRIVAL TIME: 0630 RIDING HOME WITH: WIFE GLORIA [phone] NPO STATUS: NPO AFTER MN, NO CANDY MINT, GUM AM MEDICATIONS: ATORVASTATIN PRE-OP ORDERS: YES LABS: APPT 6-16 FOR PRE-OP LABS  COMMENTS/CONCERNS:N/A ICE,;RN, BSN.

## 2018-06-25 ENCOUNTER — Encounter (HOSPITAL_COMMUNITY)
Admission: RE | Admit: 2018-06-25 | Discharge: 2018-06-25 | Disposition: A | Discharge status: Home / Self Care | Payer: BC Managed Care – PPO | Source: Ambulatory Visit | Attending: Surgery | Admitting: Surgery

## 2018-06-25 DIAGNOSIS — K649 Unspecified hemorrhoids: Secondary | ICD-10-CM | POA: Insufficient documentation

## 2018-06-25 DIAGNOSIS — Z955 Presence of coronary angioplasty implant and graft: Secondary | ICD-10-CM | POA: Diagnosis not present

## 2018-06-25 DIAGNOSIS — I2510 Atherosclerotic heart disease of native coronary artery without angina pectoris: Secondary | ICD-10-CM | POA: Diagnosis not present

## 2018-06-25 DIAGNOSIS — Z79899 Other long term (current) drug therapy: Secondary | ICD-10-CM | POA: Diagnosis not present

## 2018-06-25 DIAGNOSIS — E785 Hyperlipidemia, unspecified: Secondary | ICD-10-CM | POA: Diagnosis not present

## 2018-06-25 DIAGNOSIS — J302 Other seasonal allergic rhinitis: Secondary | ICD-10-CM | POA: Diagnosis not present

## 2018-06-25 DIAGNOSIS — K620 Anal polyp: Secondary | ICD-10-CM | POA: Insufficient documentation

## 2018-06-25 DIAGNOSIS — K642 Third degree hemorrhoids: Secondary | ICD-10-CM | POA: Diagnosis not present

## 2018-06-25 DIAGNOSIS — Z01810 Encounter for preprocedural cardiovascular examination: Secondary | ICD-10-CM | POA: Insufficient documentation

## 2018-06-25 DIAGNOSIS — K645 Perianal venous thrombosis: Secondary | ICD-10-CM | POA: Diagnosis not present

## 2018-06-25 LAB — CBC WITH DIFFERENTIAL/PLATELET
Abs Immature Granulocytes: 0.02 10*3/uL (ref 0.00–0.07)
Basophils Absolute: 0 10*3/uL (ref 0.0–0.1)
Basophils Relative: 1 %
Eosinophils Absolute: 0.1 10*3/uL (ref 0.0–0.5)
Eosinophils Relative: 3 %
HCT: 42.7 % (ref 39.0–52.0)
Hemoglobin: 14.9 g/dL (ref 13.0–17.0)
Immature Granulocytes: 1 %
Lymphocytes Relative: 35 %
Lymphs Abs: 1.5 10*3/uL (ref 0.7–4.0)
MCH: 32.8 pg (ref 26.0–34.0)
MCHC: 34.9 g/dL (ref 30.0–36.0)
MCV: 94.1 fL (ref 80.0–100.0)
Monocytes Absolute: 0.3 10*3/uL (ref 0.1–1.0)
Monocytes Relative: 8 %
Neutro Abs: 2.3 10*3/uL (ref 1.7–7.7)
Neutrophils Relative %: 52 %
Platelets: 144 10*3/uL — ABNORMAL LOW (ref 150–400)
RBC: 4.54 MIL/uL (ref 4.22–5.81)
RDW: 12.2 % (ref 11.5–15.5)
WBC: 4.3 10*3/uL (ref 4.0–10.5)
nRBC: 0 % (ref 0.0–0.2)

## 2018-06-25 LAB — COMPREHENSIVE METABOLIC PANEL
ALT: 18 U/L (ref 0–44)
AST: 21 U/L (ref 15–41)
Albumin: 4.3 g/dL (ref 3.5–5.0)
Alkaline Phosphatase: 76 U/L (ref 38–126)
Anion gap: 10 (ref 5–15)
BUN: 9 mg/dL (ref 8–23)
CO2: 26 mmol/L (ref 22–32)
Calcium: 9.4 mg/dL (ref 8.9–10.3)
Chloride: 105 mmol/L (ref 98–111)
Creatinine, Ser: 0.74 mg/dL (ref 0.61–1.24)
GFR calc Af Amer: >60 mL/min (ref >60)
GFR calc non Af Amer: >60 mL/min (ref >60)
Glucose, Bld: 101 mg/dL — ABNORMAL HIGH (ref 70–99)
Potassium: 4.7 mmol/L (ref 3.5–5.1)
Sodium: 141 mmol/L (ref 135–145)
Total Bilirubin: 1.1 mg/dL (ref 0.3–1.2)
Total Protein: 7 g/dL (ref 6.5–8.1)

## 2018-06-25 LAB — PROTIME-INR
INR: 1 (ref 0.8–1.2)
Prothrombin Time: 12.9 seconds (ref 11.4–15.2)

## 2018-06-25 LAB — APTT: aPTT: 47 seconds — ABNORMAL HIGH (ref 24–36)

## 2018-06-25 LAB — ABO/RH: ABO/RH(D): B POS

## 2018-06-25 MED ORDER — BUPIVACAINE LIPOSOME 1.3 % IJ SUSP
20.0000 mL | Freq: Once | INTRAMUSCULAR | Status: DC
  Filled 2018-06-25: qty 20

## 2018-06-25 NOTE — Anesthesia Preprocedure Evaluation (Addendum)
Anesthesia Evaluation  Patient identified by MRN, date of birth, ID band Patient awake    Reviewed: Allergy & Precautions, NPO status , Patient's Chart, lab work & pertinent test results  Airway Mallampati: II  TM Distance: >3 FB Neck ROM: Full    Dental no notable dental hx. (+) Teeth Intact   Pulmonary neg pulmonary ROS,    Pulmonary exam normal breath sounds clear to auscultation       Cardiovascular Exercise Tolerance: Good + CAD  negative cardio ROS Normal cardiovascular exam Rhythm:Regular Rate:Normal  S/p stent in 1999   Neuro/Psych negative neurological ROS  negative psych ROS   GI/Hepatic negative GI ROS, Neg liver ROS,   Endo/Other  negative endocrine ROS  Renal/GU negative Renal ROS     Musculoskeletal negative musculoskeletal ROS (+)   Abdominal   Peds  Hematology hgb 14.9 plt 144   Anesthesia Other Findings   Reproductive/Obstetrics                            Lab Results  Component Value Date   CREATININE 0.74 06/25/2018   BUN 9 06/25/2018   NA 141 06/25/2018   K 4.7 06/25/2018   CL 105 06/25/2018   CO2 26 06/25/2018    Lab Results  Component Value Date   WBC 4.3 06/25/2018   HGB 14.9 06/25/2018   HCT 42.7 06/25/2018   MCV 94.1 06/25/2018   PLT 144 (L) 06/25/2018    Anesthesia Physical Anesthesia Plan  ASA: II  Anesthesia Plan: General   Post-op Pain Management:    Induction: Intravenous  PONV Risk Score and Plan: 3 and Treatment may vary due to age or medical condition, Ondansetron and Dexamethasone  Airway Management Planned: Oral ETT  Additional Equipment:   Intra-op Plan:   Post-operative Plan:   Informed Consent: I have reviewed the patients History and Physical, chart, labs and discussed the procedure including the risks, benefits and alternatives for the proposed anesthesia with the patient or authorized representative who has indicated  his/her understanding and acceptance.     Dental advisory given  Plan Discussed with:   Anesthesia Plan Comments: (LMA if Lthotomy)      Anesthesia Quick Evaluation

## 2018-06-25 NOTE — Progress Notes (Signed)
Patient called for covid screening. No answer. Left message for patient to call with any new onset of symptoms of illness or exposure.

## 2018-06-25 NOTE — Progress Notes (Signed)
SPOKE W/  _[name]     SCREENING SYMPTOMS OF COVID 19:   COUGH--n  RUNNY NOSE---n   SORE THROAT---n  NASAL CONGESTION----n SNEEZING----n  SHORTNESS OF BREATH---n  DIFFICULTY BREATHING---n  TEMP >100.0 -----n  UNEXPLAINED BODY ACHES-----n-  CHILLS -------- n  HEADACHES ---------n  LOSS OF SMELL/ TASTE --------n    HAVE YOU OR ANY FAMILY MEMBER TRAVELLED PAST 14 DAYS OUT OF THE   COUNTY--- STATE----to [place], but not since covid test was done COUNTRY----  HAVE YOU OR ANY FAMILY MEMBER BEEN EXPOSED TO ANYONE WITH COVID 19? no

## 2018-06-26 ENCOUNTER — Ambulatory Visit (HOSPITAL_BASED_OUTPATIENT_CLINIC_OR_DEPARTMENT_OTHER): Payer: BC Managed Care – PPO | Admitting: Anesthesiology

## 2018-06-26 ENCOUNTER — Encounter (HOSPITAL_BASED_OUTPATIENT_CLINIC_OR_DEPARTMENT_OTHER): Payer: Self-pay | Admitting: *Deleted

## 2018-06-26 ENCOUNTER — Ambulatory Visit (HOSPITAL_BASED_OUTPATIENT_CLINIC_OR_DEPARTMENT_OTHER)
Admission: RE | Admit: 2018-06-26 | Discharge: 2018-06-26 | Disposition: A | Discharge status: Home / Self Care | Payer: BC Managed Care – PPO | Attending: Surgery | Admitting: Surgery

## 2018-06-26 ENCOUNTER — Other Ambulatory Visit: Payer: Self-pay

## 2018-06-26 ENCOUNTER — Encounter (HOSPITAL_BASED_OUTPATIENT_CLINIC_OR_DEPARTMENT_OTHER)
Admission: RE | Disposition: A | Discharge status: Home / Self Care | Payer: Self-pay | Source: Home / Self Care | Attending: Surgery

## 2018-06-26 DIAGNOSIS — J302 Other seasonal allergic rhinitis: Secondary | ICD-10-CM | POA: Insufficient documentation

## 2018-06-26 DIAGNOSIS — K620 Anal polyp: Secondary | ICD-10-CM | POA: Diagnosis not present

## 2018-06-26 DIAGNOSIS — K649 Unspecified hemorrhoids: Secondary | ICD-10-CM | POA: Diagnosis not present

## 2018-06-26 DIAGNOSIS — K644 Residual hemorrhoidal skin tags: Secondary | ICD-10-CM | POA: Diagnosis not present

## 2018-06-26 DIAGNOSIS — E785 Hyperlipidemia, unspecified: Secondary | ICD-10-CM | POA: Diagnosis not present

## 2018-06-26 DIAGNOSIS — Z955 Presence of coronary angioplasty implant and graft: Secondary | ICD-10-CM | POA: Insufficient documentation

## 2018-06-26 DIAGNOSIS — K642 Third degree hemorrhoids: Secondary | ICD-10-CM | POA: Insufficient documentation

## 2018-06-26 DIAGNOSIS — Z79899 Other long term (current) drug therapy: Secondary | ICD-10-CM | POA: Diagnosis not present

## 2018-06-26 DIAGNOSIS — I2510 Atherosclerotic heart disease of native coronary artery without angina pectoris: Secondary | ICD-10-CM | POA: Diagnosis not present

## 2018-06-26 DIAGNOSIS — K645 Perianal venous thrombosis: Secondary | ICD-10-CM | POA: Diagnosis not present

## 2018-06-26 HISTORY — DX: Malignant (primary) neoplasm, unspecified: C80.1

## 2018-06-26 HISTORY — PX: RECTAL EXAM UNDER ANESTHESIA: SHX6399

## 2018-06-26 HISTORY — PX: HEMORRHOID SURGERY: SHX153

## 2018-06-26 HISTORY — DX: Unspecified hemorrhoids: K64.9

## 2018-06-26 LAB — TYPE AND SCREEN
ABO/RH(D): B POS
Antibody Screen: NEGATIVE

## 2018-06-26 SURGERY — EXAM UNDER ANESTHESIA, RECTUM
Anesthesia: General | Site: Anus

## 2018-06-26 MED ORDER — DIBUCAINE (PERIANAL) 1 % EX OINT
TOPICAL_OINTMENT | CUTANEOUS | Status: DC | PRN
  Administered 2018-06-26: 1 via RECTAL

## 2018-06-26 MED ORDER — EPHEDRINE 5 MG/ML INJ
INTRAVENOUS | Status: AC
  Filled 2018-06-26: qty 10

## 2018-06-26 MED ORDER — ACETAMINOPHEN 500 MG PO TABS
ORAL_TABLET | ORAL | Status: AC
  Filled 2018-06-26: qty 2

## 2018-06-26 MED ORDER — ONDANSETRON HCL 4 MG/2ML IJ SOLN
INTRAMUSCULAR | Status: AC
  Filled 2018-06-26: qty 2

## 2018-06-26 MED ORDER — OXYCODONE HCL 5 MG PO TABS: 5.0000 mg | ORAL_TABLET | Freq: Four times a day (QID) | ORAL | 0 refills | Status: AC | PRN

## 2018-06-26 MED ORDER — ACETAMINOPHEN 10 MG/ML IV SOLN
1000.0000 mg | Freq: Once | INTRAVENOUS | Status: DC | PRN
  Filled 2018-06-26: qty 100

## 2018-06-26 MED ORDER — EPHEDRINE SULFATE 50 MG/ML IJ SOLN
INTRAMUSCULAR | Status: DC | PRN
  Administered 2018-06-26: 20 mg via INTRAVENOUS

## 2018-06-26 MED ORDER — ACETAMINOPHEN 500 MG PO TABS
1000.0000 mg | ORAL_TABLET | ORAL | Status: AC
  Administered 2018-06-26: 1000 mg via ORAL
  Filled 2018-06-26: qty 2

## 2018-06-26 MED ORDER — GABAPENTIN 300 MG PO CAPS
300.0000 mg | ORAL_CAPSULE | ORAL | Status: AC
  Administered 2018-06-26: 07:00:00 300 mg via ORAL
  Filled 2018-06-26: qty 1

## 2018-06-26 MED ORDER — LIDOCAINE 2% (20 MG/ML) 5 ML SYRINGE
INTRAMUSCULAR | Status: AC
  Filled 2018-06-26: qty 5

## 2018-06-26 MED ORDER — PROPOFOL 10 MG/ML IV BOLUS
INTRAVENOUS | Status: DC | PRN
  Administered 2018-06-26: 150 mg via INTRAVENOUS

## 2018-06-26 MED ORDER — FENTANYL CITRATE (PF) 100 MCG/2ML IJ SOLN
25.0000 ug | INTRAMUSCULAR | Status: DC | PRN
  Filled 2018-06-26: qty 1

## 2018-06-26 MED ORDER — KETOROLAC TROMETHAMINE 30 MG/ML IJ SOLN
INTRAMUSCULAR | Status: DC | PRN
  Administered 2018-06-26: 30 mg via INTRAVENOUS

## 2018-06-26 MED ORDER — SUCCINYLCHOLINE CHLORIDE 200 MG/10ML IV SOSY
PREFILLED_SYRINGE | INTRAVENOUS | Status: AC
  Filled 2018-06-26: qty 10

## 2018-06-26 MED ORDER — CHLORHEXIDINE GLUCONATE CLOTH 2 % EX PADS
6.0000 | MEDICATED_PAD | Freq: Once | CUTANEOUS | Status: DC
  Filled 2018-06-26: qty 6

## 2018-06-26 MED ORDER — ONDANSETRON HCL 4 MG/2ML IJ SOLN
4.0000 mg | Freq: Once | INTRAMUSCULAR | Status: DC | PRN
  Filled 2018-06-26: qty 2

## 2018-06-26 MED ORDER — BUPIVACAINE LIPOSOME 1.3 % IJ SUSP
INTRAMUSCULAR | Status: DC | PRN
  Administered 2018-06-26: 20 mL

## 2018-06-26 MED ORDER — LIDOCAINE HCL (CARDIAC) PF 100 MG/5ML IV SOSY
PREFILLED_SYRINGE | INTRAVENOUS | Status: DC | PRN
  Administered 2018-06-26: 100 mg via INTRAVENOUS

## 2018-06-26 MED ORDER — FENTANYL CITRATE (PF) 100 MCG/2ML IJ SOLN
INTRAMUSCULAR | Status: AC
  Filled 2018-06-26: qty 2

## 2018-06-26 MED ORDER — BUPIVACAINE-EPINEPHRINE 0.25% -1:200000 IJ SOLN
INTRAMUSCULAR | Status: DC | PRN
  Administered 2018-06-26: 30 mL

## 2018-06-26 MED ORDER — DEXAMETHASONE SODIUM PHOSPHATE 10 MG/ML IJ SOLN
INTRAMUSCULAR | Status: AC
  Filled 2018-06-26: qty 1

## 2018-06-26 MED ORDER — SUCCINYLCHOLINE CHLORIDE 20 MG/ML IJ SOLN
INTRAMUSCULAR | Status: DC | PRN
  Administered 2018-06-26: 80 mg via INTRAVENOUS

## 2018-06-26 MED ORDER — ONDANSETRON HCL 4 MG/2ML IJ SOLN
INTRAMUSCULAR | Status: DC | PRN
  Administered 2018-06-26: 4 mg via INTRAVENOUS

## 2018-06-26 MED ORDER — PROPOFOL 10 MG/ML IV BOLUS
INTRAVENOUS | Status: AC
  Filled 2018-06-26: qty 40

## 2018-06-26 MED ORDER — DEXAMETHASONE SODIUM PHOSPHATE 4 MG/ML IJ SOLN
INTRAMUSCULAR | Status: DC | PRN
  Administered 2018-06-26: 10 mg via INTRAVENOUS

## 2018-06-26 MED ORDER — FLEET ENEMA 7-19 GM/118ML RE ENEM
1.0000 | ENEMA | Freq: Once | RECTAL | Status: DC
  Filled 2018-06-26: qty 1

## 2018-06-26 MED ORDER — LACTATED RINGERS IV SOLN
INTRAVENOUS | Status: DC
  Administered 2018-06-26 (×2): via INTRAVENOUS
  Filled 2018-06-26: qty 1000

## 2018-06-26 MED ORDER — GABAPENTIN 300 MG PO CAPS
ORAL_CAPSULE | ORAL | Status: AC
  Filled 2018-06-26: qty 1

## 2018-06-26 MED ORDER — FENTANYL CITRATE (PF) 100 MCG/2ML IJ SOLN
INTRAMUSCULAR | Status: DC | PRN
  Administered 2018-06-26: 25 ug via INTRAVENOUS
  Administered 2018-06-26: 75 ug via INTRAVENOUS

## 2018-06-26 SURGICAL SUPPLY — 58 items
APL SKNCLS STERI-STRIP NONHPOA (GAUZE/BANDAGES/DRESSINGS) ×2
BENZOIN TINCTURE PRP APPL 2/3 (GAUZE/BANDAGES/DRESSINGS) ×4 IMPLANT
BLADE EXTENDED COATED 6.5IN (ELECTRODE) IMPLANT
BLADE HEX COATED 2.75 (ELECTRODE) ×2 IMPLANT
BLADE SURG 10 STRL SS (BLADE) IMPLANT
BLADE SURG 15 STRL LF DISP TIS (BLADE) ×1 IMPLANT
BLADE SURG 15 STRL SS (BLADE) ×2
BRIEF STRETCH FOR OB PAD LRG (UNDERPADS AND DIAPERS) ×4 IMPLANT
CANISTER SUCT 3000ML PPV (MISCELLANEOUS) ×2 IMPLANT
COVER BACK TABLE 60X90IN (DRAPES) ×2 IMPLANT
COVER MAYO STAND STRL (DRAPES) ×2 IMPLANT
COVER WAND RF STERILE (DRAPES) ×4 IMPLANT
DECANTER SPIKE VIAL GLASS SM (MISCELLANEOUS) ×2 IMPLANT
DRAPE LAPAROTOMY 100X72 PEDS (DRAPES) ×2 IMPLANT
DRAPE UTILITY XL STRL (DRAPES) ×2 IMPLANT
GAUZE SPONGE 4X4 12PLY STRL (GAUZE/BANDAGES/DRESSINGS) ×2 IMPLANT
GLOVE BIO SURGEON STRL SZ7.5 (GLOVE) ×2 IMPLANT
GLOVE INDICATOR 8.0 STRL GRN (GLOVE) ×2 IMPLANT
GOWN STRL REUS W/TWL LRG LVL3 (GOWN DISPOSABLE) ×2 IMPLANT
HYDROGEN PEROXIDE 16OZ (MISCELLANEOUS) IMPLANT
IV CATH 14GX2 1/4 (CATHETERS) IMPLANT
IV CATH 18G SAFETY (IV SOLUTION) IMPLANT
KIT TURNOVER CYSTO (KITS) ×2 IMPLANT
LOOP VESSEL MAXI BLUE (MISCELLANEOUS) IMPLANT
NDL SAFETY ECLIPSE 18X1.5 (NEEDLE) IMPLANT
NEEDLE HYPO 18GX1.5 SHARP (NEEDLE)
NEEDLE HYPO 22GX1.5 SAFETY (NEEDLE) ×2 IMPLANT
NS IRRIG 500ML POUR BTL (IV SOLUTION) ×2 IMPLANT
PACK BASIN DAY SURGERY FS (CUSTOM PROCEDURE TRAY) ×2 IMPLANT
PAD ABD 8X10 STRL (GAUZE/BANDAGES/DRESSINGS) ×2 IMPLANT
PAD ARMBOARD 7.5X6 YLW CONV (MISCELLANEOUS) ×1 IMPLANT
PENCIL BUTTON HOLSTER BLD 10FT (ELECTRODE) ×2 IMPLANT
SPONGE HEMORRHOID 8X3CM (HEMOSTASIS) IMPLANT
SPONGE SURGIFOAM ABS GEL 12-7 (HEMOSTASIS) IMPLANT
SUCTION FRAZIER HANDLE 10FR (MISCELLANEOUS)
SUCTION TUBE FRAZIER 10FR DISP (MISCELLANEOUS) IMPLANT
SUT CHROMIC 2 0 SH (SUTURE) IMPLANT
SUT CHROMIC 3 0 SH 27 (SUTURE) IMPLANT
SUT MNCRL AB 4-0 PS2 18 (SUTURE) ×1 IMPLANT
SUT SILK 0 PSL NDL (SUTURE) IMPLANT
SUT SILK 0 TIES 10X30 (SUTURE) IMPLANT
SUT SILK 2 0 (SUTURE)
SUT SILK 2-0 18XBRD TIE 12 (SUTURE) IMPLANT
SUT VIC AB 2-0 SH 27 (SUTURE) ×4
SUT VIC AB 2-0 SH 27XBRD (SUTURE) IMPLANT
SUT VIC AB 3-0 SH 18 (SUTURE) ×1 IMPLANT
SUT VIC AB 3-0 SH 27 (SUTURE) ×4
SUT VIC AB 3-0 SH 27X BRD (SUTURE) IMPLANT
SUT VIC AB 3-0 SH 27XBRD (SUTURE) IMPLANT
SUT VIC AB 4-0 P-3 18XBRD (SUTURE) IMPLANT
SUT VIC AB 4-0 P3 18 (SUTURE)
SYR 20CC LL (SYRINGE) IMPLANT
SYR BULB IRRIGATION 50ML (SYRINGE) ×2 IMPLANT
SYR CONTROL 10ML LL (SYRINGE) ×2 IMPLANT
TOWEL OR 17X26 10 PK STRL BLUE (TOWEL DISPOSABLE) ×2 IMPLANT
TRAY DSU PREP LF (CUSTOM PROCEDURE TRAY) ×2 IMPLANT
TUBE CONNECTING 12X1/4 (SUCTIONS) ×2 IMPLANT
YANKAUER SUCT BULB TIP NO VENT (SUCTIONS) ×2 IMPLANT

## 2018-06-26 NOTE — Anesthesia Procedure Notes (Signed)
Procedure Name: Intubation Date/Time: 06/26/2018 8:32 AM Performed by: Justice Rocher, CRNA Pre-anesthesia Checklist: Patient identified, Emergency Drugs available, Suction available and Patient being monitored Patient Re-evaluated:Patient Re-evaluated prior to induction Oxygen Delivery Method: Circle system utilized Preoxygenation: Pre-oxygenation with 100% oxygen Induction Type: IV induction Ventilation: Mask ventilation without difficulty Laryngoscope Size: Mac and 4 Grade View: Grade II Tube type: Oral Tube size: 7.5 mm Number of attempts: 1 Airway Equipment and Method: Stylet and Oral airway Placement Confirmation: ETT inserted through vocal cords under direct vision,  positive ETCO2 and breath sounds checked- equal and bilateral Secured at: 23 cm Tube secured with: Tape Dental Injury: Teeth and Oropharynx as per pre-operative assessment

## 2018-06-26 NOTE — H&P (Signed)
CC: Here today for surgery - hemorrhoidal tissue  HPI: [name] is a very pleasant 73 year old male with hx of seasonal allergies, CAD (s/p stent 1999), HLD, ED - presented as a referral by Dr. Kieth Brightly with a history of intermittent bleeding from his anus and tissue prolapse which he first noticed 03/2017. He was told by his PCP he had hemorrhoids and after having persistent issues was referred to our office. He states he has a bowel movement every day to every other day. He noted some discomfort with bowel movements but no pain between. Its soft and firm but sometimes soft. He is not taking a daily fiber supplement. He drinks less than 64 ounces of water per day. He spends between 5 and 10 minutes on the commode with a bowel movement. Following a bowel movement with whatever prolapses out, he does note that he is able to successfully pushed back up himself. He is also noted multiple nosebleeds. He takes an aspirin.  Colonoscopy 02/08/2016 which was normal  We repeated his colonoscopy 12/13/17 which was notable for hemorrhoids and a 3 mm polyp at the splenic flexure. This was removed. Pathology returned tubular adenoma. This was discussed with him and recommendations for repeat colonoscopy in 5 years.  INTERVAL HX His symptoms had been improving but appear to be worsening now. He is taking a fiber supplement and when necessary MiraLAX. He reports having a bowel movement almost every day and are typically soft. Over the last month or so he's had sharp knifelike sensation with stool and some associated bright red blood. The pain subsequently subsided during the ensuing 30 minutes. He is still having tissue prolapse with mucoid drainage this caused some hygiene related issues. He denies any incontinence to gas, liquid stool, or solid stool.  Past Medical History:  Diagnosis Date  . Cancer (HCC)    SKIN CANCER EAR AND FOREHEAD , FROZEN OFF  . Coronary artery disease    coronary  stent - 1999 - released by carsdiologist several years ago per patient , 06-24-2018 DENIES ACUTE CARDIAC SX   . Hemorrhoids     Past Surgical History:  Procedure Laterality Date  . COLONOSCOPY N/A 12/13/2017   Procedure: COLONOSCOPY;  Surgeon: Ileana Roup, MD;  Location: [facility];  Service: General;  Laterality: N/A;  . COLONOSCOPY WITH PROPOFOL N/A 02/08/2016   Procedure: COLONOSCOPY WITH PROPOFOL;  Surgeon: Garlan Fair, MD;  Location: [facility];  Service: Endoscopy;  Laterality: N/A;  . NO PAST SURGERIES     Patient had a stent palced in 1999  . POLYPECTOMY  12/13/2017   Procedure: POLYPECTOMY;  Surgeon: Ileana Roup, MD;  Location: [facility];  Service: General;;    History reviewed. No pertinent family history.  Social:  reports that he has never smoked. He has never used smokeless tobacco. He reports that he does not drink alcohol or use drugs.  Allergies: No Known Allergies  Medications: I have reviewed the patient's current medications.  Results for orders placed or performed during the hospital encounter of 06/26/18 (from the past 48 hour(s))  APTT     Status: Abnormal   Collection Time: 06/25/18 10:35 AM  Result Value Ref Range   aPTT 47 (H) 24 - 36 seconds    Comment:        IF BASELINE aPTT IS ELEVATED, SUGGEST PATIENT RISK ASSESSMENT BE USED TO DETERMINE APPROPRIATE ANTICOAGULANT THERAPY. Performed at [facility], [place] [address] [place], [place] [address]  CBC WITH DIFFERENTIAL     Status: Abnormal   Collection Time: 06/25/18 10:35 AM  Result Value Ref Range   WBC 4.3 4.0 - 10.5 K/uL   RBC 4.54 4.22 - 5.81 MIL/uL   Hemoglobin 14.9 13.0 - 17.0 g/dL   HCT 42.7 39.0 - 52.0 %   MCV 94.1 80.0 - 100.0 fL   MCH 32.8 26.0 - 34.0 pg   MCHC 34.9 30.0 - 36.0 g/dL   RDW 12.2 11.5 - 15.5 %   Platelets 144 (L) 150 - 400 K/uL   nRBC 0.0 0.0 - 0.2 %   Neutrophils Relative % 52 %   Neutro Abs 2.3 1.7 - 7.7 K/uL    Lymphocytes Relative 35 %   Lymphs Abs 1.5 0.7 - 4.0 K/uL   Monocytes Relative 8 %   Monocytes Absolute 0.3 0.1 - 1.0 K/uL   Eosinophils Relative 3 %   Eosinophils Absolute 0.1 0.0 - 0.5 K/uL   Basophils Relative 1 %   Basophils Absolute 0.0 0.0 - 0.1 K/uL   Immature Granulocytes 1 %   Abs Immature Granulocytes 0.02 0.00 - 0.07 K/uL    Comment: Performed at [facility], [place] [address], [place], [place] [address]  Comprehensive metabolic panel     Status: Abnormal   Collection Time: 06/25/18 10:35 AM  Result Value Ref Range   Sodium 141 135 - 145 mmol/L   Potassium 4.7 3.5 - 5.1 mmol/L   Chloride 105 98 - 111 mmol/L   CO2 26 22 - 32 mmol/L   Glucose, Bld 101 (H) 70 - 99 mg/dL   BUN 9 8 - 23 mg/dL   Creatinine, Ser 0.74 0.61 - 1.24 mg/dL   Calcium 9.4 8.9 - 10.3 mg/dL   Total Protein 7.0 6.5 - 8.1 g/dL   Albumin 4.3 3.5 - 5.0 g/dL   AST 21 15 - 41 U/L   ALT 18 0 - 44 U/L   Alkaline Phosphatase 76 38 - 126 U/L   Total Bilirubin 1.1 0.3 - 1.2 mg/dL   GFR calc non Af Amer >60 >60 mL/min   GFR calc Af Amer >60 >60 mL/min   Anion gap 10 5 - 15    Comment: Performed at [facility], [place] [address], [place], [place] [address]  Protime-INR     Status: None   Collection Time: 06/25/18 10:35 AM  Result Value Ref Range   Prothrombin Time 12.9 11.4 - 15.2 seconds   INR 1.0 0.8 - 1.2    Comment: (NOTE) INR goal varies based on device and disease states. Performed at [facility], [place] [address] [place], [place] [address]   Type and screen     Status: None   Collection Time: 06/25/18 10:35 AM  Result Value Ref Range   ABO/RH(D) B POS    Antibody Screen NEG    Sample Expiration 07/09/2018,2359    Extend sample reason      NO TRANSFUSIONS OR PREGNANCY IN THE PAST 3 MONTHS Performed at [facility], [place] [address] [place], [place] [address]   ABO/Rh     Status: None   Collection Time: 06/25/18 10:35 AM   Result Value Ref Range   ABO/RH(D)      B POS Performed at [facility] [place] [address] [place], [place] [address]     No results found.  ROS - all of the below systems have been reviewed with the patient and positives  are indicated with bold text General: chills, fever or night sweats Eyes: blurry vision or double vision ENT: epistaxis or sore throat Allergy/Immunology: itchy/watery eyes or nasal congestion Hematologic/Lymphatic: bleeding problems, blood clots or swollen lymph nodes Endocrine: temperature intolerance or unexpected weight changes Breast: new or changing breast lumps or nipple discharge Resp: cough, shortness of breath, or wheezing CV: chest pain or dyspnea on exertion GI: as per HPI GU: dysuria, trouble voiding, or hematuria MSK: joint pain or joint stiffness Neuro: TIA or stroke symptoms Derm: pruritus and skin lesion changes Psych: anxiety and depression  PE Blood pressure 112/70, pulse (!) 52, temperature 97.9 F (36.6 C), temperature source Oral, resp. rate 16, height 5\' 8"  (1.727 m), weight 86 kg, SpO2 98 %. Constitutional: NAD; conversant; no deformities Eyes: Moist conjunctiva; no lid lag; anicteric; PERRL Neck: Trachea midline; no thyromegaly Lungs: Normal respiratory effort; no tactile fremitus CV: RRR; no palpable thrills; no pitting edema GI: Abd soft, NT/ND; no palpable hepatosplenomegaly MSK: Normal gait; no clubbing/cyanosis Psychiatric: Appropriate affect; alert and oriented x3 Lymphatic: No palpable cervical or axillary lymphadenopathy  Results for orders placed or performed during the hospital encounter of 06/26/18 (from the past 48 hour(s))  APTT     Status: Abnormal   Collection Time: 06/25/18 10:35 AM  Result Value Ref Range   aPTT 47 (H) 24 - 36 seconds    Comment:        IF BASELINE aPTT IS ELEVATED, SUGGEST PATIENT RISK ASSESSMENT BE USED TO DETERMINE APPROPRIATE ANTICOAGULANT THERAPY. Performed at [facility], [place] [address], [place], [place] [address]   CBC WITH DIFFERENTIAL     Status: Abnormal   Collection Time: 06/25/18 10:35 AM  Result Value Ref Range   WBC 4.3 4.0 - 10.5 K/uL   RBC 4.54 4.22 - 5.81 MIL/uL   Hemoglobin 14.9 13.0 - 17.0 g/dL   HCT 42.7 39.0 - 52.0 %   MCV 94.1 80.0 - 100.0 fL   MCH 32.8 26.0 - 34.0 pg   MCHC 34.9 30.0 - 36.0 g/dL   RDW 12.2 11.5 - 15.5 %   Platelets 144 (L) 150 - 400 K/uL   nRBC 0.0 0.0 - 0.2 %   Neutrophils Relative % 52 %   Neutro Abs 2.3 1.7 - 7.7 K/uL   Lymphocytes Relative 35 %   Lymphs Abs 1.5 0.7 - 4.0 K/uL   Monocytes Relative 8 %   Monocytes Absolute 0.3 0.1 - 1.0 K/uL   Eosinophils Relative 3 %   Eosinophils Absolute 0.1 0.0 - 0.5 K/uL   Basophils Relative 1 %   Basophils Absolute 0.0 0.0 - 0.1 K/uL   Immature Granulocytes 1 %   Abs Immature Granulocytes 0.02 0.00 - 0.07 K/uL    Comment: Performed at [facility] [place] [address], [place], [place] [address]  Comprehensive metabolic panel     Status: Abnormal   Collection Time: 06/25/18 10:35 AM  Result Value Ref Range   Sodium 141 135 - 145 mmol/L   Potassium 4.7 3.5 - 5.1 mmol/L   Chloride 105 98 - 111 mmol/L   CO2 26 22 - 32 mmol/L   Glucose, Bld 101 (H) 70 - 99 mg/dL   BUN 9 8 - 23 mg/dL   Creatinine, Ser 0.74 0.61 - 1.24 mg/dL   Calcium 9.4 8.9 - 10.3 mg/dL   Total Protein 7.0 6.5 - 8.1 g/dL   Albumin 4.3 3.5 - 5.0 g/dL   AST 21 15 -  41 U/L   ALT 18 0 - 44 U/L   Alkaline Phosphatase 76 38 - 126 U/L   Total Bilirubin 1.1 0.3 - 1.2 mg/dL   GFR calc non Af Amer >60 >60 mL/min   GFR calc Af Amer >60 >60 mL/min   Anion gap 10 5 - 15    Comment: Performed at [facility], [place] [address] [place] [address], [place], [place] [address]  Protime-INR     Status: None   Collection Time: 06/25/18 10:35 AM  Result Value Ref Range   Prothrombin Time 12.9 11.4 - 15.2 seconds   INR 1.0 0.8 - 1.2    Comment: (NOTE) INR goal varies based on  device and disease states. Performed at [facility], [place] [address], [place], [place] [address]   Type and screen     Status: None   Collection Time: 06/25/18 10:35 AM  Result Value Ref Range   ABO/RH(D) B POS    Antibody Screen NEG    Sample Expiration 07/09/2018,2359    Extend sample reason      NO TRANSFUSIONS OR PREGNANCY IN THE PAST 3 MONTHS Performed at [facility], [place] [address], [place], [place] [address]   ABO/Rh     Status: None   Collection Time: 06/25/18 10:35 AM  Result Value Ref Range   ABO/RH(D)      B POS Performed at [facility], [place] [address] [place], [place] [address]    A/P:  Mr. Delaguila is a very pleasant 66yoM with rectal bleeding, grade III/IV internal hemorrhoids; repeat colonoscopy 12/13/17 - small TA splenic flexure + hemorrhoids; no other significant findings. Possible posterior midline anal fissure as well  -We have discussed the anatomy and physiology of the GI tract and pathophysiology of hemorrhoids with associated illustrations -We discussed options going forward including further observation with risks of persistent symptoms as well as not having tissue biopsy of what could be abnormal anal canal mucosa. Given the persistence of his symptoms and now some new potentially a normal-appearing anal canal mucosa overlying the prolapsing hemorrhoids (versus changes related to chronic prolapse of the hemorrhoids) we discussed proceeding with anorectal exam under anesthesia, possible hemorrhoidectomy today. -His fissure symptoms have improved with topical diltiazem/nifedipine. We will avoid doing multiple procedures this go round given hemorrhoidectomy procedure. -The anatomy and physiology of the anal canal was discussed at length with the patient. The pathophysiology of hemorrhoids and fissures was discussed at length with associated pictures and illustrations. -The planned procedure, material risks  (including, but not limited to, pain, bleeding, infection including pelvic sepsis and resultant stoma/death, scarring, need for blood transfusion, damage to anal sphincter, incontinence of gas and/or stool, need for additional procedures, recurrence of hemorrhoidal issues, anal stenosis, pneumonia, heart attack, stroke, death) benefits and alternatives to surgery were discussed at length. The patient's questions were answered to his satisfaction, he voiced understanding and elected to proceed with surgery. Additionally, we discussed typical postoperative expectations and the recovery process.  Sharon Mt. Dema Severin, M.D. General and Colorectal Surgery [facility]

## 2018-06-26 NOTE — Anesthesia Postprocedure Evaluation (Signed)
Anesthesia Post Note  Patient: Canary Brim Fennewald  Procedure(s) Performed: ANORECTAL EXAM UNDER ANESTHESIA (N/A Anus) HEMORRHOIDECTOMY (N/A Anus)     Patient location during evaluation: PACU Anesthesia Type: General Level of consciousness: awake and alert Pain management: pain level controlled Vital Signs Assessment: post-procedure vital signs reviewed and stable Respiratory status: spontaneous breathing, nonlabored ventilation, respiratory function stable and patient connected to nasal cannula oxygen Cardiovascular status: blood pressure returned to baseline and stable Postop Assessment: no apparent nausea or vomiting Anesthetic complications: no    Last Vitals:  Vitals:   06/26/18 1000 06/26/18 1010  BP: 135/68   Pulse: 77   Resp: 12 11  Temp:    SpO2: 99%     Last Pain:  Vitals:   06/26/18 1000  TempSrc:   PainSc: 0-No pain                 Barnet Glasgow

## 2018-06-26 NOTE — Discharge Instructions (Addendum)
ANORECTAL SURGERY: POST OP INSTRUCTIONS  1. DIET: Follow a light bland diet the first 24 hours after arrival home, such as soup, liquids, crackers, etc.  Be sure to include lots of fluids daily.  Avoid fast food or heavy meals as your are more likely to get nauseated.  Eat a low fat diet the next few days after surgery.    2. Take your usually prescribed home medications unless otherwise directed.  3. Next dose tylenol as need after 1100 4. Next dose Motrin after 3:30 pm as need  5. PAIN CONTROL: a. It is helpful to take an over-the-counter pain medication regularly for the first few days/weeks.  Choose from the following that works best for you: i. Ibuprofen (Advil, etc) Three 200mg  tabs every 6 hours as needed. ii. Acetaminophen (Tylenol, etc) 500-650mg  every 6 hours as needed iii. NOTE: You may take both of these medications together - most patients find it most helpful when alternating between the two (i.e. Ibuprofen at 6am, tylenol at 9am, ibuprofen at 12pm ...) b. A  prescription for pain medication may have been prescribed for you at discharge.  Take your pain medication as prescribed.  i. If you are having problems/concerns with the prescription medicine, please call us for further advice.  6. Avoid getting constipated.  Between the surgery and the pain medications, it is common to experience some constipation.  Increasing fluid intake (64oz of water per day) and taking a fiber supplement (such as Metamucil, Citrucel, FiberCon) 1-2 times a day regularly will usually help prevent this problem from occurring.  Take Miralax (over the counter) 1-2x/day while taking a narcotic pain medication. If no bowel movement after 48hours, you may additionally take a laxative like a bottle of Milk of Magnesia which can be purchased over the counter. Avoid enemas if possible as these are often painful.   7. Watch out for diarrhea.  If you have many loose bowel movements, simplify your diet to bland foods.   Stop any stool softeners and decrease your fiber supplement. If this worsens or does not improve, please call us.  8. Wash / shower every day.  If you were discharged with a dressing, you may remove this the day after your surgery. You may shower normally, getting soap/water on your wound, particularly after bowel movements.  9. Soaking in a warm bath filled a couple inches ("Sitz bath") is a great way to clean the area after a bowel movement and many patients find it is a way to soothe the area.  10. ACTIVITIES as tolerated:   a. You may resume regular (light) daily activities beginning the next day--such as daily self-care, walking, climbing stairs--gradually increasing activities as tolerated.  If you can walk 30 minutes without difficulty, it is safe to try more intense activity such as jogging, treadmill, bicycling, low-impact aerobics, etc. b. Refrain from any heavy lifting or straining for the first 2 weeks after your procedure, particularly if your surgery was for hemorrhoids. c. Avoid activities that make your pain worse d. You may drive when you are no longer taking prescription pain medication, you can comfortably wear a seatbelt, and you can safely maneuver your car and apply brakes.  11. FOLLOW UP in our office a. Please call [facility] at [phone] to set up an appointment to see your surgeon in the office for a follow-up appointment approximately 2 weeks after your surgery. b. Make sure that you call for this appointment the day you arrive home to insure a  convenient appointment time.  9. If you have disability or family leave forms that need to be completed, you may have them completed by your primary care physician's office; for return to work instructions, please ask our office staff and they will be happy to assist you in obtaining this documentation   When to call us [phone]: 1. Poor pain control 2. Reactions / problems with new medications (rash/itching, etc)  3. Fever  over 101.5 F (38.5 C) 4. Inability to urinate 5. Nausea/vomiting 6. Worsening swelling or bruising 7. Continued bleeding from incision. 8. Increased pain, redness, or drainage from the incision  The clinic staff is available to answer your questions during regular business hours (8:30am-5pm).  Please dont hesitate to call and ask to speak to one of our nurses for clinical concerns.   A surgeon from [facility] is always on call at the hospitals   If you have a medical emergency, go to the nearest emergency room or call 911.   [facility], [place], [place], [place], [place]  [address] ? MAIN: [phone] FAX [phone] [email or web address]  Post Anesthesia Home Care Instructions  Activity: Get plenty of rest for the remainder of the day. A responsible individual must stay with you for 24 hours following the procedure.  For the next 24 hours, DO NOT: -Drive a car -Paediatric nurse -Drink alcoholic beverages -Take any medication unless instructed by your physician -Make any legal decisions or sign important papers.  Meals: Start with liquid foods such as gelatin or soup. Progress to regular foods as tolerated. Avoid greasy, spicy, heavy foods. If nausea and/or vomiting occur, drink only clear liquids until the nausea and/or vomiting subsides. Call your physician if vomiting continues.  Special Instructions/Symptoms: Your throat may feel dry or sore from the anesthesia or the breathing tube placed in your throat during surgery. If this causes discomfort, gargle with warm salt water. The discomfort should disappear within 24 hours.  If you had a scopolamine patch placed behind your ear for the management of post- operative nausea and/or vomiting:  1. The medication in the patch is effective for 72 hours, after which it should be removed.  Wrap patch in a tissue and discard in the trash. Wash hands thoroughly with soap and  water. 2. You may remove the patch earlier than 72 hours if you experience unpleasant side effects which may include dry mouth, dizziness or visual disturbances. 3. Avoid touching the patch. Wash your hands with soap and water after contact with the patch.    Information for Discharge Teaching: EXPAREL (bupivacaine liposome injectable suspension)   Your surgeon or anesthesiologist gave you EXPAREL(bupivacaine) to help control your pain after surgery.   EXPAREL is a local anesthetic that provides pain relief by numbing the tissue around the surgical site.  EXPAREL is designed to release pain medication over time and can control pain for up to 72 hours.  Depending on how you respond to EXPAREL, you may require less pain medication during your recovery.  Possible side effects:  Temporary loss of sensation or ability to move in the area where bupivacaine was injected.  Nausea, vomiting, constipation  Rarely, numbness and tingling in your mouth or lips, lightheadedness, or anxiety may occur.  Call your doctor right away if you think you may be experiencing any of these sensations, or if you have other questions regarding possible side effects.  Follow all other discharge instructions given to you by your surgeon  or nurse. Eat a healthy diet and drink plenty of water or other fluids.  If you return to the hospital for any reason within 96 hours following the administration of EXPAREL, it is important for health care providers to know that you have received this anesthetic. A teal colored band has been placed on your arm with the date, time and amount of EXPAREL you have received in order to alert and inform your health care providers. Please leave this armband in place for the full 96 hours following administration, and then you may remove the band.

## 2018-06-26 NOTE — Op Note (Signed)
06/26/2018  9:26 AM  PATIENT:  Steven Harrison  73 y.o. male  Patient Care Team: Alroy Dust, L.Marlou Sa, MD as PCP - General (Family Medicine)  PRE-OPERATIVE DIAGNOSIS:  Symptomatic, medically refractory internal (grade III) and external hemorrhoids  POST-OPERATIVE DIAGNOSIS:  Symptomatic, medically refractory internal (grade III) and external hemorrhoids - internal component with evidence of thrombosis.   PROCEDURE:   1. Hemorrhoidectomy - right anterior/lateral 2. Anorectal exam under anesthesia  SURGEON:  Surgeon(s): Ileana Roup, MD  ASSISTANT: OR staff   ANESTHESIA:   general  SPECIMEN:  Right anterolateral hemorrhoidal bundle - 2 pieces  DISPOSITION OF SPECIMEN:  PATHOLOGY  COUNTS:  Sponge, needle, and instrument counts were reported correct x2 at conclusion.  EBL: 20 mL  Drains: None  PLAN OF CARE: Discharge to home after PACU  PATIENT DISPOSITION:  PACU - hemodynamically stable.  INDICATION: Steven Harrison is a very pleasant 73 year old male with hx of seasonal allergies, CAD (s/p stent 1999), HLD, ED - presented as a referral by Dr. Kieth Brightly with a history of intermittent bleeding from his anus and tissue prolapse which he first noticed 03/2017. He was told by his PCP he had hemorrhoids and after having persistent issues was referred to our office. He states he has a bowel movement every day to every other day. He noted some discomfort with bowel movements but no pain between. Its soft and firm but sometimes soft. He is not taking a daily fiber supplement. He drinks less than 64 ounces of water per day. He spends between 5 and 10 minutes on the commode with a bowel movement. Following a bowel movement with whatever prolapses out, he does note that he is able to successfully pushed back up himself. He is also noted multiple nosebleeds. He takes an aspirin.  Colonoscopy 02/08/2016 which was normal  We repeated his colonoscopy 12/13/17 which was notable for  hemorrhoids and a 3 mm polyp at the splenic flexure. This was removed. Pathology returned tubular adenoma. This was discussed with him and recommendations for repeat colonoscopy in 5 years.  INTERVAL HX His symptoms had been improving but appear to be worsening now. He is taking a fiber supplement and when necessary MiraLAX. He reports having a bowel movement almost every day and are typically soft. Over the last month or so he's had sharp knifelike sensation with stool and some associated bright red blood. The pain subsequently subsided during the ensuing 30 minutes. He is still having tissue prolapse with mucoid drainage this caused some hygiene related issues. He denies any incontinence to gas, liquid stool, or solid stool.  Options had been discussed with him and he opted to pursue surgery to address his hemorrhoids. Please refer to notes elsewhere for details regarding this discussion  OR FINDINGS:  Healed posterior midline scar consistent with prior anal fissure. Prolapsed thrombosed internal hemorrhoid - right lateral anal canal; additional internal hemorrhoidal tissue at right anterior portion removed as well. Small mixed internal/external hemorrhoid left lateral left in situ to limit the amount of hemorrhoidal tissue excision at this time and lower risk of anal stenosis  DESCRIPTION: The patient was identified in the preoperative holding area and taken to the OR. SCDs were placed.  General endotracheal anesthesia was induced without difficulty. He was then positioned in prone on the operating table with the buttocks gently taped apart.  He was then prepped and draped in usual sterile fashion.  A surgical timeout was performed indicating the correct patient, procedure, positioning and need  for preoperative antibiotics.  A rectal block was performed using a dilute mixture of Marcaine with epinephrine mixed with Exparel.    On exam, there was a prolapsed partially thrombosed internal  hemorrhoid which originated in the right lateral/anterior position of the anal canal. Scar in posterior midline was evident consistent with prior fissure which has since healed. A well lubricated digital rectal exam was performed which demonstrated no palpable masses.  A lubricated Hill-Ferguson anoscope was into the anal canal and circumferential inspection demonstrated most significant hemorrhoids being in the right lateral/anterior position. Primarily internal component. He had small decompressed hemorrhoids left lateral as well.   Attention was turned to addressing the most significant problem first - the right lateral/anterior component. The hemorrhoidal tissue was bunched up with Debakey forceps. The planned area of excision was marked with cautery, taking care to limit the extent of excision of the external component and anoderm. The hemorrhoidal tissue was then incised and dissected free of the sphincter muscle - this was preserved free of injury. The base of the hemorrhoidal tissue was clamped with a tonsil and a 2-0 vicryl figure-of-eight suture placed to ligate. This was then divided with electrocautery. The specimen was then passed off. Additional internal hemorrhoidal tissue was excised from the more anterior border on the right side. The pedicle here also oversewn with 2-0 vicryl figure-of-eight suture and the specimen added as 2nd piece of tissue. Small external hemorrhoidal vessels were carefully removed from the underlying sphincter muscle. The mucosa was then reapproximated with a running locking 3-0 vicryl suture. The anoderm was approximated with a running 4-0 monocryl suture. The anal canal was irrigated with sterile saline and hemostasis verified.  Given the small nature of the left lateral bundle and the more significant hemorrhoidectomy on the right side, the decision was made to limit this to the right side to lower the risk for anal canal stenosis.  Hemostasis was then re-verified.  Sponge, needle and instrument counts were reported correct x 2.  A piece of proctofoam coated in dibucaine ointment was placed in the anal canal. The area was dressed with 4x4 dressings, ABD pad and secured with mesh underwear. He was then taken out of the prone position onto a stretcher, awaken from anesthesia, extubated and transferred to recovery in satisfactory condition.  DISPOSITION: PACU in satisfactory condition.

## 2018-06-26 NOTE — Transfer of Care (Signed)
Immediate Anesthesia Transfer of Care Note  Patient: Steven Harrison  Procedure(s) Performed: Procedure(s) (LRB): ANORECTAL EXAM UNDER ANESTHESIA (N/A) HEMORRHOIDECTOMY (N/A)  Patient Location: PACU  Anesthesia Type: General  Level of Consciousness: awake, sedated, patient cooperative and responds to stimulation  Airway & Oxygen Therapy: Patient Spontanous Breathing and Patient connected to Felt O2 with soft FM   Post-op Assessment: Report given to PACU RN, Post -op Vital signs reviewed and stable and Patient moving all extremities  Post vital signs: Reviewed and stable  Complications: No apparent anesthesia complications

## 2018-06-27 ENCOUNTER — Encounter (HOSPITAL_BASED_OUTPATIENT_CLINIC_OR_DEPARTMENT_OTHER): Payer: Self-pay | Admitting: Surgery

## 2018-10-09 IMAGING — DX DG CHEST 2V
2 series · 2 of 2 positions shown · non-contrast
Comparison: CT abdomen 12/11/2005.

CLINICAL DATA: Preoperative exam for nose bleed.

EXAM:
CHEST - 2 VIEW

[dg chest 2 view (1 of 2)]
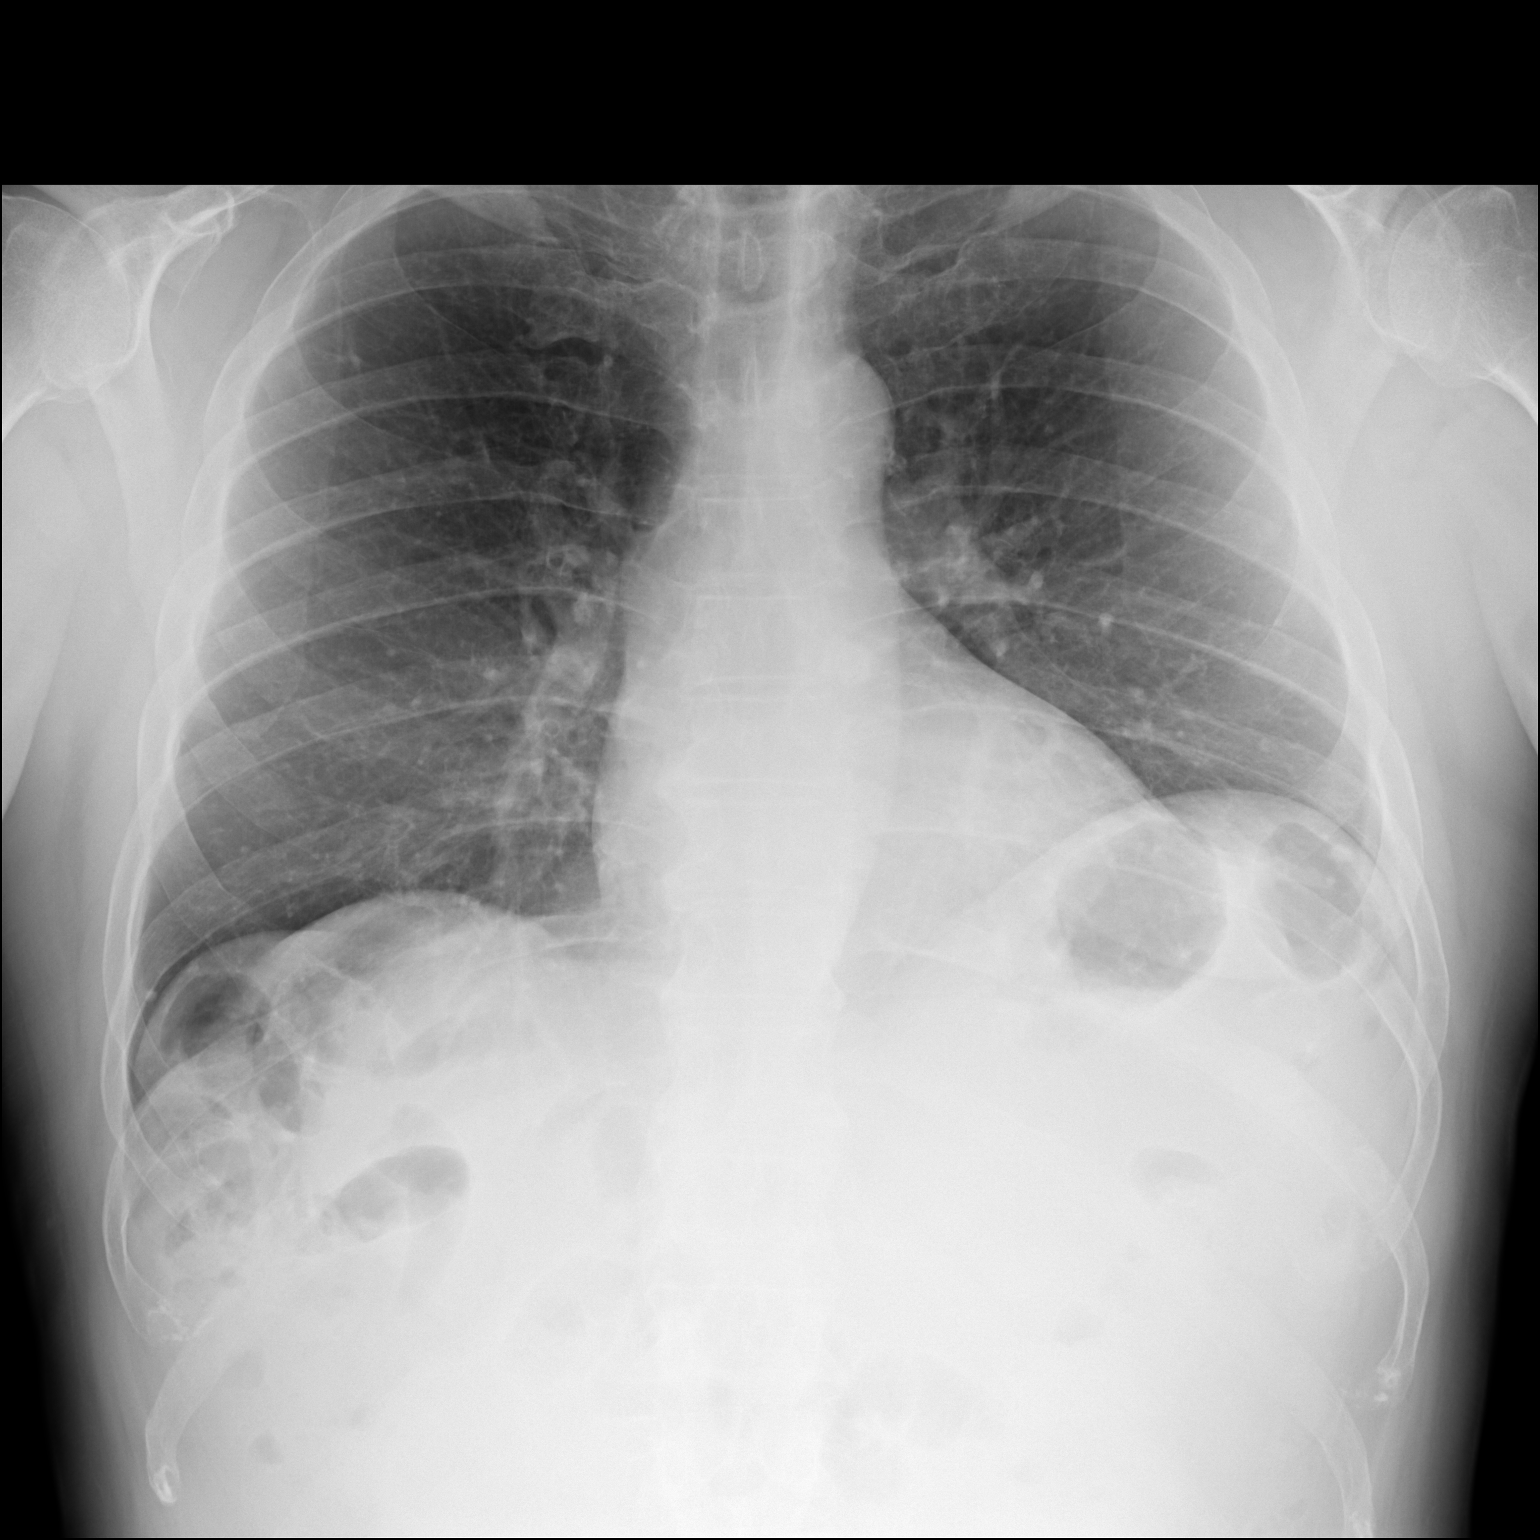

[dg chest 2 view (2 of 2)]
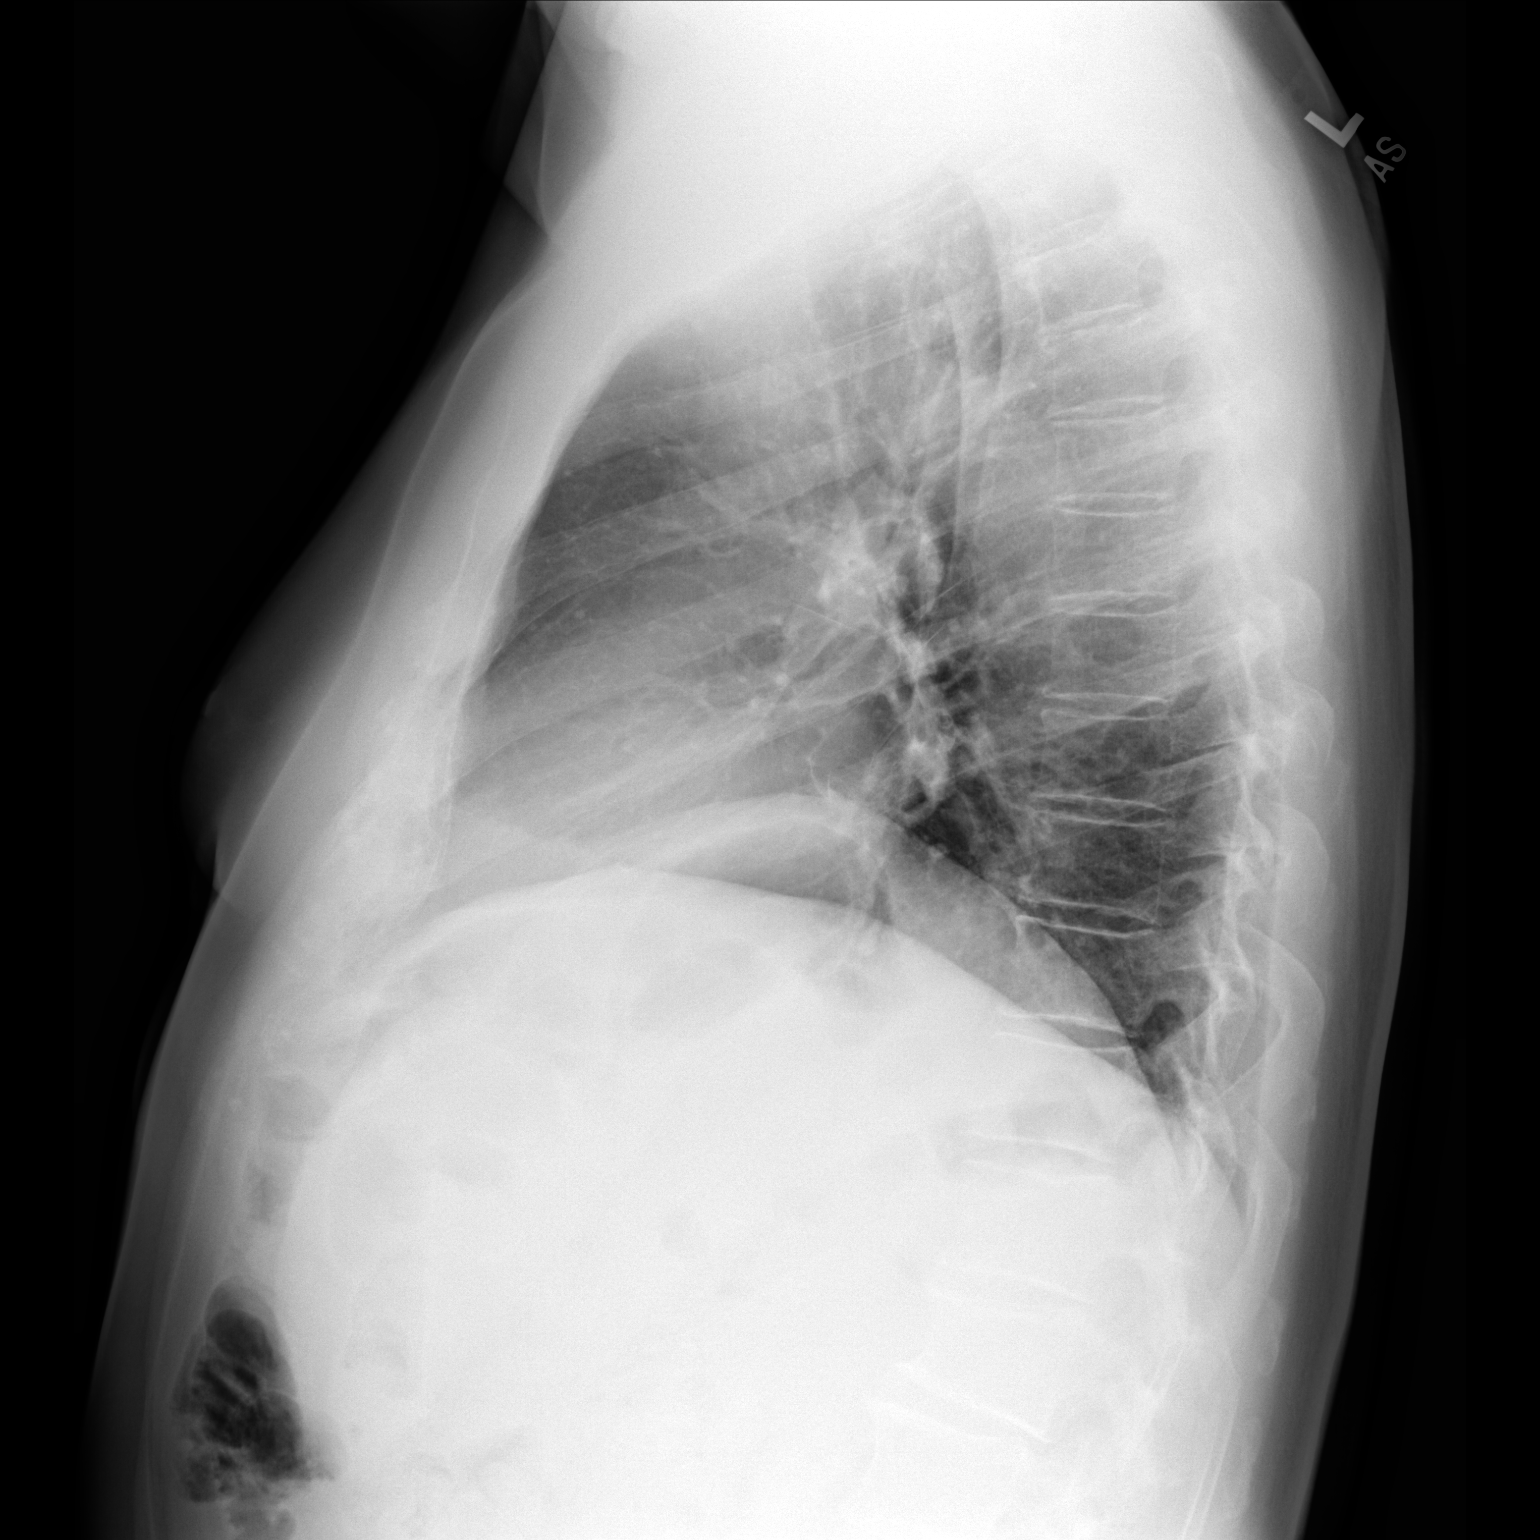

[2 of 2 positions shown; findings below may reference images not displayed]

FINDINGS: Heart size is normal. Mediastinal shadows are normal. There is mild
elevation of the left hemidiaphragm. This was present on scout view
from an abdominal CT in Friday December, 2005 and is therefore chronic.
There is mild chronic volume loss at the left lung base. The lungs
are otherwise clear. No effusions. No significant bone finding.
IMPRESSION: Chronic elevation of the left hemidiaphragm with mild chronic volume
loss at the left lung base.

## 2018-10-21 DIAGNOSIS — R040 Epistaxis: Secondary | ICD-10-CM | POA: Diagnosis not present

## 2018-10-21 DIAGNOSIS — Z23 Encounter for immunization: Secondary | ICD-10-CM | POA: Diagnosis not present

## 2019-02-04 ENCOUNTER — Other Ambulatory Visit: Payer: Medicare Other

## 2019-02-04 ENCOUNTER — Ambulatory Visit: Payer: Medicare Other | Attending: Internal Medicine

## 2019-03-02 ENCOUNTER — Ambulatory Visit: Payer: Medicare Other | Attending: Internal Medicine

## 2019-03-02 DIAGNOSIS — Z23 Encounter for immunization: Secondary | ICD-10-CM | POA: Insufficient documentation

## 2019-03-02 NOTE — Progress Notes (Signed)
   Covid-19 Vaccination Clinic  Name:  Steven Harrison    MRN: [id number] DOB: [birth year]  03/02/2019  [name] was observed post Covid-19 immunization for 15 minutes without incidence. He was provided with Vaccine Information Sheet and instruction to access the V-Safe system.   [name] was instructed to call 911 with any severe reactions post vaccine: Marland Kitchen Difficulty breathing  . Swelling of your face and throat  . A fast heartbeat  . A bad rash all over your body  . Dizziness and weakness    Immunizations Administered    Name Date Dose VIS Date Route   Pfizer COVID-19 Vaccine 03/02/2019  9:42 AM 0.3 mL 12/20/2018 Intramuscular   Manufacturer: [place]   Lot: [id number]   [place]: [id number]

## 2019-03-04 DIAGNOSIS — N529 Male erectile dysfunction, unspecified: Secondary | ICD-10-CM | POA: Diagnosis not present

## 2019-03-04 DIAGNOSIS — Z0000 Encounter for general adult medical examination without abnormal findings: Secondary | ICD-10-CM | POA: Diagnosis not present

## 2019-03-04 DIAGNOSIS — Z125 Encounter for screening for malignant neoplasm of prostate: Secondary | ICD-10-CM | POA: Diagnosis not present

## 2019-03-04 DIAGNOSIS — R040 Epistaxis: Secondary | ICD-10-CM | POA: Diagnosis not present

## 2019-03-04 DIAGNOSIS — I2510 Atherosclerotic heart disease of native coronary artery without angina pectoris: Secondary | ICD-10-CM | POA: Diagnosis not present

## 2019-03-04 DIAGNOSIS — E7800 Pure hypercholesterolemia, unspecified: Secondary | ICD-10-CM | POA: Diagnosis not present

## 2019-03-04 DIAGNOSIS — Z8042 Family history of malignant neoplasm of prostate: Secondary | ICD-10-CM | POA: Diagnosis not present

## 2019-03-26 ENCOUNTER — Ambulatory Visit: Payer: Medicare Other | Attending: Internal Medicine

## 2019-03-26 DIAGNOSIS — Z23 Encounter for immunization: Secondary | ICD-10-CM

## 2019-03-26 NOTE — Progress Notes (Signed)
   Covid-19 Vaccination Clinic  Name:  Steven Harrison    MRN: [id number] DOB: Jan 09, 1946  03/26/2019  Steven Harrison was observed post Covid-19 immunization for 15 minutes without incident. He was provided with Vaccine Information Sheet and instruction to access the V-Safe system.   Steven Harrison was instructed to call 911 with any severe reactions post vaccine: Marland Kitchen Difficulty breathing  . Swelling of face and throat  . A fast heartbeat  . A bad rash all over body  . Dizziness and weakness   Immunizations Administered    Name Date Dose VIS Date Route   Pfizer COVID-19 Vaccine 03/26/2019  8:29 AM 0.3 mL 12/20/2018 Intramuscular   Manufacturer: [place]   Lot: [id number]   [place]: [id number]

## 2019-03-27 DIAGNOSIS — R040 Epistaxis: Secondary | ICD-10-CM | POA: Diagnosis not present

## 2019-05-29 DIAGNOSIS — H2513 Age-related nuclear cataract, bilateral: Secondary | ICD-10-CM | POA: Diagnosis not present

## 2019-05-29 DIAGNOSIS — H52209 Unspecified astigmatism, unspecified eye: Secondary | ICD-10-CM | POA: Diagnosis not present

## 2019-05-29 DIAGNOSIS — H02834 Dermatochalasis of left upper eyelid: Secondary | ICD-10-CM | POA: Diagnosis not present

## 2019-05-29 DIAGNOSIS — H02831 Dermatochalasis of right upper eyelid: Secondary | ICD-10-CM | POA: Diagnosis not present

## 2019-06-13 DIAGNOSIS — H53483 Generalized contraction of visual field, bilateral: Secondary | ICD-10-CM | POA: Diagnosis not present

## 2019-06-13 DIAGNOSIS — H02831 Dermatochalasis of right upper eyelid: Secondary | ICD-10-CM | POA: Diagnosis not present

## 2019-06-13 DIAGNOSIS — H0279 Other degenerative disorders of eyelid and periocular area: Secondary | ICD-10-CM | POA: Diagnosis not present

## 2019-06-13 DIAGNOSIS — H02834 Dermatochalasis of left upper eyelid: Secondary | ICD-10-CM | POA: Diagnosis not present

## 2019-06-13 DIAGNOSIS — H02413 Mechanical ptosis of bilateral eyelids: Secondary | ICD-10-CM | POA: Diagnosis not present

## 2019-06-13 DIAGNOSIS — E7800 Pure hypercholesterolemia, unspecified: Secondary | ICD-10-CM | POA: Insufficient documentation

## 2019-06-13 DIAGNOSIS — H57813 Brow ptosis, bilateral: Secondary | ICD-10-CM | POA: Diagnosis not present

## 2019-06-26 DIAGNOSIS — H53482 Generalized contraction of visual field, left eye: Secondary | ICD-10-CM | POA: Diagnosis not present

## 2019-06-26 DIAGNOSIS — H53481 Generalized contraction of visual field, right eye: Secondary | ICD-10-CM | POA: Diagnosis not present

## 2019-06-26 DIAGNOSIS — H53483 Generalized contraction of visual field, bilateral: Secondary | ICD-10-CM | POA: Diagnosis not present

## 2019-07-23 DIAGNOSIS — H0279 Other degenerative disorders of eyelid and periocular area: Secondary | ICD-10-CM | POA: Diagnosis not present

## 2019-07-23 DIAGNOSIS — H02831 Dermatochalasis of right upper eyelid: Secondary | ICD-10-CM | POA: Diagnosis not present

## 2019-07-23 DIAGNOSIS — H02413 Mechanical ptosis of bilateral eyelids: Secondary | ICD-10-CM | POA: Diagnosis not present

## 2019-07-23 DIAGNOSIS — H57813 Brow ptosis, bilateral: Secondary | ICD-10-CM | POA: Diagnosis not present

## 2019-07-23 DIAGNOSIS — H02834 Dermatochalasis of left upper eyelid: Secondary | ICD-10-CM | POA: Diagnosis not present

## 2019-07-23 DIAGNOSIS — H53483 Generalized contraction of visual field, bilateral: Secondary | ICD-10-CM | POA: Diagnosis not present

## 2019-09-04 ENCOUNTER — Other Ambulatory Visit (HOSPITAL_COMMUNITY): Payer: Self-pay | Admitting: Family Medicine

## 2019-10-23 ENCOUNTER — Ambulatory Visit: Payer: Medicare Other | Attending: Critical Care Medicine

## 2019-10-23 DIAGNOSIS — Z23 Encounter for immunization: Secondary | ICD-10-CM

## 2019-10-23 NOTE — Progress Notes (Signed)
   Covid-19 Vaccination Clinic  Name:  REINALDO HELT    MRN: [id number] DOB: [birth year]  10/23/2019  Mr. Echavarria was observed post Covid-19 immunization for 15 minutes without incident. He was provided with Vaccine Information Sheet and instruction to access the V-Safe system.   Mr. Gruenhagen was instructed to call 911 with any severe reactions post vaccine: Marland Kitchen Difficulty breathing  . Swelling of face and throat  . A fast heartbeat  . A bad rash all over body  . Dizziness and weakness

## 2019-11-05 DIAGNOSIS — Z09 Encounter for follow-up examination after completed treatment for conditions other than malignant neoplasm: Secondary | ICD-10-CM | POA: Diagnosis not present

## 2019-11-05 DIAGNOSIS — H57811 Brow ptosis, right: Secondary | ICD-10-CM | POA: Diagnosis not present

## 2019-11-15 DIAGNOSIS — Z23 Encounter for immunization: Secondary | ICD-10-CM | POA: Diagnosis not present

## 2019-11-17 ENCOUNTER — Other Ambulatory Visit (HOSPITAL_COMMUNITY): Payer: Self-pay | Admitting: Family Medicine

## 2020-03-05 DIAGNOSIS — R200 Anesthesia of skin: Secondary | ICD-10-CM | POA: Diagnosis not present

## 2020-03-05 DIAGNOSIS — E7800 Pure hypercholesterolemia, unspecified: Secondary | ICD-10-CM | POA: Diagnosis not present

## 2020-03-05 DIAGNOSIS — N529 Male erectile dysfunction, unspecified: Secondary | ICD-10-CM | POA: Diagnosis not present

## 2020-03-05 DIAGNOSIS — I2510 Atherosclerotic heart disease of native coronary artery without angina pectoris: Secondary | ICD-10-CM | POA: Diagnosis not present

## 2020-03-05 DIAGNOSIS — Z0000 Encounter for general adult medical examination without abnormal findings: Secondary | ICD-10-CM | POA: Diagnosis not present

## 2020-03-05 DIAGNOSIS — N400 Enlarged prostate without lower urinary tract symptoms: Secondary | ICD-10-CM | POA: Diagnosis not present

## 2020-03-05 DIAGNOSIS — Z23 Encounter for immunization: Secondary | ICD-10-CM | POA: Diagnosis not present

## 2020-04-19 ENCOUNTER — Other Ambulatory Visit (HOSPITAL_COMMUNITY): Payer: Self-pay

## 2020-04-21 ENCOUNTER — Other Ambulatory Visit (HOSPITAL_COMMUNITY): Payer: Self-pay

## 2020-04-28 ENCOUNTER — Other Ambulatory Visit (HOSPITAL_COMMUNITY): Payer: Self-pay

## 2020-04-28 MED ORDER — SILDENAFIL CITRATE 100 MG PO TABS
ORAL_TABLET | ORAL | 4 refills | Status: DC
  Filled 2020-04-28: qty 4, 30d supply, fill #0
  Filled 2020-05-28: qty 4, 30d supply, fill #1
  Filled 2020-11-26: qty 4, 30d supply, fill #2
  Filled 2020-11-26: qty 4, 30d supply, fill #0

## 2020-04-30 ENCOUNTER — Other Ambulatory Visit (HOSPITAL_COMMUNITY): Payer: Self-pay

## 2020-05-10 ENCOUNTER — Encounter (HOSPITAL_COMMUNITY): Payer: Self-pay

## 2020-05-10 ENCOUNTER — Other Ambulatory Visit: Payer: Self-pay

## 2020-05-10 ENCOUNTER — Emergency Department (HOSPITAL_COMMUNITY): Payer: BLUE CROSS/BLUE SHIELD

## 2020-05-10 ENCOUNTER — Emergency Department (HOSPITAL_COMMUNITY)
Admission: EM | Admit: 2020-05-10 | Discharge: 2020-05-10 | Disposition: A | Discharge status: Home / Self Care | Payer: BLUE CROSS/BLUE SHIELD | Attending: Emergency Medicine | Admitting: Emergency Medicine

## 2020-05-10 DIAGNOSIS — Z79899 Other long term (current) drug therapy: Secondary | ICD-10-CM | POA: Insufficient documentation

## 2020-05-10 DIAGNOSIS — I2510 Atherosclerotic heart disease of native coronary artery without angina pectoris: Secondary | ICD-10-CM | POA: Diagnosis not present

## 2020-05-10 DIAGNOSIS — K5900 Constipation, unspecified: Secondary | ICD-10-CM | POA: Diagnosis not present

## 2020-05-10 DIAGNOSIS — R339 Retention of urine, unspecified: Secondary | ICD-10-CM | POA: Diagnosis not present

## 2020-05-10 DIAGNOSIS — Z85828 Personal history of other malignant neoplasm of skin: Secondary | ICD-10-CM | POA: Insufficient documentation

## 2020-05-10 LAB — CBC WITH DIFFERENTIAL/PLATELET
Abs Immature Granulocytes: 0.02 10*3/uL (ref 0.00–0.07)
Basophils Absolute: 0 10*3/uL (ref 0.0–0.1)
Basophils Relative: 0 %
Eosinophils Absolute: 0 10*3/uL (ref 0.0–0.5)
Eosinophils Relative: 0 %
HCT: 41.8 % (ref 39.0–52.0)
Hemoglobin: 14.5 g/dL (ref 13.0–17.0)
Immature Granulocytes: 0 %
Lymphocytes Relative: 14 %
Lymphs Abs: 1.2 10*3/uL (ref 0.7–4.0)
MCH: 32.4 pg (ref 26.0–34.0)
MCHC: 34.7 g/dL (ref 30.0–36.0)
MCV: 93.3 fL (ref 80.0–100.0)
Monocytes Absolute: 0.5 10*3/uL (ref 0.1–1.0)
Monocytes Relative: 5 %
Neutro Abs: 7 10*3/uL (ref 1.7–7.7)
Neutrophils Relative %: 81 %
Platelets: 160 10*3/uL (ref 150–400)
RBC: 4.48 MIL/uL (ref 4.22–5.81)
RDW: 12 % (ref 11.5–15.5)
WBC: 8.7 10*3/uL (ref 4.0–10.5)
nRBC: 0 % (ref 0.0–0.2)

## 2020-05-10 LAB — BASIC METABOLIC PANEL
Anion gap: 8 (ref 5–15)
BUN: 9 mg/dL (ref 8–23)
CO2: 25 mmol/L (ref 22–32)
Calcium: 9.3 mg/dL (ref 8.9–10.3)
Chloride: 102 mmol/L (ref 98–111)
Creatinine, Ser: 0.92 mg/dL (ref 0.61–1.24)
GFR, Estimated: >60 mL/min (ref >60)
Glucose, Bld: 115 mg/dL — ABNORMAL HIGH (ref 70–99)
Potassium: 4.4 mmol/L (ref 3.5–5.1)
Sodium: 135 mmol/L (ref 135–145)

## 2020-05-10 LAB — URINALYSIS, ROUTINE W REFLEX MICROSCOPIC
Bilirubin Urine: NEGATIVE
Glucose, UA: NEGATIVE mg/dL
Hgb urine dipstick: NEGATIVE
Ketones, ur: 20 mg/dL — AB
Leukocytes,Ua: NEGATIVE
Nitrite: NEGATIVE
Protein, ur: NEGATIVE mg/dL
Specific Gravity, Urine: 1.015 (ref 1.005–1.030)
pH: 6 (ref 5.0–8.0)

## 2020-05-10 MED ORDER — ONDANSETRON HCL 4 MG/2ML IJ SOLN
4.0000 mg | Freq: Once | INTRAMUSCULAR | Status: AC
  Administered 2020-05-10: 4 mg via INTRAVENOUS
  Filled 2020-05-10: qty 2

## 2020-05-10 MED ORDER — FENTANYL CITRATE (PF) 100 MCG/2ML IJ SOLN
50.0000 ug | Freq: Once | INTRAMUSCULAR | Status: AC
  Administered 2020-05-10: 50 ug via INTRAVENOUS
  Filled 2020-05-10: qty 2

## 2020-05-10 NOTE — Discharge Instructions (Signed)
Please read and follow all provided instructions.  Your diagnoses today include:  1. Urinary retention   2. Constipation, unspecified constipation type     Tests performed today include: Blood cell counts (white, red, and platelets) Electrolytes  Kidney function test Urine test to check for infection - no sign of infection  Vital signs. See below for your results today.   Medications prescribed:   None  Take any prescribed medications only as directed.  Home care instructions:  Follow any educational materials contained in this packet.  Continue MiraLAX as directed on packaging for constipation.  You may sit in a tub of warm water 2-3 times a day if you have significant rectal pain.  Follow-up instructions: Call urology for an appointment tomorrow.    Return instructions:   Please return to the Emergency Department if you experience worsening symptoms.   Please return if you have any other emergent concerns.  Additional Information:  Your vital signs today were: BP 124/73   Pulse 61   Temp 98.4 F (36.9 C) (Oral)   Resp 13   Ht 5\' 8"  (1.727 m)   Wt 85.7 kg   SpO2 92%   BMI 28.74 kg/m  If your blood pressure (BP) was elevated above 135/85 this visit, please have this repeated by your doctor within one month. --------------

## 2020-05-10 NOTE — ED Provider Notes (Signed)
[facility] EMERGENCY DEPARTMENT Provider Note   CSN: [id number] Arrival date & time: 05/10/20  1624     History Chief Complaint  Patient presents with  . Urinary Retention  . Constipation    ESIQUIO Harrison is a 75 y.o. male.  Patient with history of hemorrhoids and rectal fissure --presents to the emergency department today with urinary retention.  Patient last urinated about 12 hours ago.  He states that he has a history of an enlarged prostate.  He has significant abdominal distention and discomfort.  He denies fevers, chest pain, shortness of breath.  He states that he has been constipated recently, in part because of pain with defecation.  Onset of symptoms gradual.  Course is worsening.  No treatments prior to arrival.  Nothing make symptoms better or worse.  He denies use of opiate pain medications, antihistamines.  States that he had a Foley catheter once previously.        Past Medical History:  Diagnosis Date  . Cancer (HCC)    SKIN CANCER EAR AND FOREHEAD , FROZEN OFF  . Coronary artery disease    coronary stent - 1999 - released by carsdiologist several years ago per patient , 06-24-2018 DENIES ACUTE CARDIAC SX   . Hemorrhoids     There are no problems to display for this patient.   Past Surgical History:  Procedure Laterality Date  . COLONOSCOPY N/A 12/13/2017   Procedure: COLONOSCOPY;  Surgeon: Ileana Roup, MD;  Location: [facility];  Service: General;  Laterality: N/A;  . COLONOSCOPY WITH PROPOFOL N/A 02/08/2016   Procedure: COLONOSCOPY WITH PROPOFOL;  Surgeon: Garlan Fair, MD;  Location: [facility];  Service: Endoscopy;  Laterality: N/A;  . HEMORRHOID SURGERY N/A 06/26/2018   Procedure: HEMORRHOIDECTOMY;  Surgeon: Ileana Roup, MD;  Location: [facility];  Service: General;  Laterality: N/A;  . NO PAST SURGERIES     Patient had a stent palced in 1999  . POLYPECTOMY  12/13/2017   Procedure:  POLYPECTOMY;  Surgeon: Ileana Roup, MD;  Location: [facility];  Service: General;;  . RECTAL EXAM UNDER ANESTHESIA N/A 06/26/2018   Procedure: ANORECTAL EXAM UNDER ANESTHESIA;  Surgeon: Ileana Roup, MD;  Location: [facility];  Service: General;  Laterality: N/A;       No family history on file.  Social History   Tobacco Use  . Smoking status: Never Smoker  . Smokeless tobacco: Never Used  Vaping Use  . Vaping Use: Never used  Substance Use Topics  . Alcohol use: No  . Drug use: No    Home Medications Prior to Admission medications   Medication Sig Start Date End Date Taking? Authorizing Provider  atorvastatin (LIPITOR) 20 MG tablet Take 20 mg by mouth daily.     [provider]  atorvastatin (LIPITOR) 20 MG tablet TAKE ONE (1) TABLET BY MOUTH EVERY DAY 90 11/17/19 11/16/20  Mitchell, L.Marlou Sa, MD  cyanocobalamin 1000 MCG tablet Take 1,000 mcg by mouth daily.    [provider]  Menthol-Zinc Oxide (CALMOSEPTINE EX) Apply 1 application topically daily as needed (irritation).    [provider]  Multiple Vitamin (MULTIVITAMIN WITH MINERALS) TABS tablet Take 1 tablet by mouth daily.    [provider]  Multiple Vitamins-Minerals (PRESERVISION AREDS 2 PO) Take by mouth daily.    [provider]  polyethylene glycol (MIRALAX / GLYCOLAX) 17 g packet Take 17 g by mouth daily as needed.  [provider]  sildenafil (VIAGRA) 100 MG tablet Take 100 mg by mouth daily as needed for erectile dysfunction. 4 per month    [provider]  sildenafil (VIAGRA) 100 MG tablet TAKE ONE (1) TABLET BY MOUTH ONE TIME AS DIRECTED 09/04/19 09/03/20  Alroy Dust, L.Marlou Sa, MD  sildenafil (VIAGRA) 100 MG tablet TAKE 1 TABLET AS NEEDED AS DIRECTED FOR SEXUAL FUNCTION. 270 04/28/20     sodium chloride (OCEAN) 0.65 % SOLN nasal spray Place 1 spray into both nostrils as needed (dryness).    [provider]  Wheat  Dextrin (BENEFIBER) POWD Take by mouth as needed.    [provider]    Allergies    Patient has no known allergies.  Review of Systems   Review of Systems  Constitutional: Negative for fever.  HENT: Negative for rhinorrhea and sore throat.   Eyes: Negative for redness.  Respiratory: Negative for cough.   Cardiovascular: Negative for chest pain.  Gastrointestinal: Positive for abdominal distention and abdominal pain. Negative for diarrhea, nausea and vomiting.  Genitourinary: Positive for difficulty urinating. Negative for dysuria and hematuria.  Musculoskeletal: Negative for myalgias.  Skin: Negative for rash.  Neurological: Negative for headaches.    Physical Exam Updated Vital Signs BP 111/67   Pulse 78   Temp 98.4 F (36.9 C) (Oral)   Resp 18   Ht 5\' 8"  (1.727 m)   Wt 85.7 kg   SpO2 97%   BMI 28.74 kg/m   Physical Exam Vitals and nursing note reviewed.  Constitutional:      Appearance: He is well-developed.  HENT:     Head: Normocephalic and atraumatic.  Eyes:     General:        Right eye: No discharge.        Left eye: No discharge.     Conjunctiva/sclera: Conjunctivae normal.  Cardiovascular:     Rate and Rhythm: Normal rate and regular rhythm.     Heart sounds: Normal heart sounds.  Pulmonary:     Effort: Pulmonary effort is normal.     Breath sounds: Normal breath sounds.  Abdominal:     Palpations: Abdomen is soft.     Tenderness: There is abdominal tenderness in the suprapubic area.  Musculoskeletal:     Cervical back: Normal range of motion and neck supple.  Skin:    General: Skin is warm and dry.  Neurological:     Mental Status: He is alert.     ED Results / Procedures / Treatments   Labs (all labs ordered are listed, but only abnormal results are displayed) Labs Reviewed  URINALYSIS, ROUTINE W REFLEX MICROSCOPIC - Abnormal; Notable for the following components:      Result Value   APPearance HAZY (*)    Ketones, ur 20 (*)     All other components within normal limits  BASIC METABOLIC PANEL - Abnormal; Notable for the following components:   Glucose, Bld 115 (*)    All other components within normal limits  URINE CULTURE  CBC WITH DIFFERENTIAL/PLATELET    EKG None  Radiology DG Abd 2 Views  Result Date: 05/10/2020 CLINICAL DATA:  Constipation urinary retention EXAM: ABDOMEN - 2 VIEW COMPARISON:  None. FINDINGS: Overall nonobstructed gas pattern. Moderate feces at the rectum. No radiopaque calculi. Lucency at the right upper quadrant likely high-riding air containing colon. IMPRESSION: Nonobstructed gas pattern with moderate feces at the rectum Electronically Signed   By: Donavan Foil M.D.   On: 05/10/2020 19:19  Procedures Procedures   Medications Ordered in ED Medications  fentaNYL (SUBLIMAZE) injection 50 mcg (50 mcg Intravenous Given 05/10/20 2058)  ondansetron (ZOFRAN) injection 4 mg (4 mg Intravenous Given 05/10/20 2058)    ED Course  I have reviewed the triage vital signs and the nursing notes.  Pertinent labs & imaging results that were available during my care of the patient were reviewed by me and considered in my medical decision making (see chart for details).  Patient seen and examined. Work-up initiated. He needs a foley, will reassess.   Vital signs reviewed and are as follows: BP 111/67   Pulse 78   Temp 98.4 F (36.9 C) (Oral)   Resp 18   Ht 5\' 8"  (1.727 m)   Wt 85.7 kg   SpO2 97%   BMI 28.74 kg/m   9:41 PM Foley placed -- patient continues to have lower abdominal pain despite foley placement. Greater than 500cc in foley and bag at bedside, urine is clear and yellow. 29mcg fentanyl/4mg  zofran ordered. Discussed with Dr. Sherry Ruffing who will see.   10:28 PM Pt feeling better.  He has started to take MiraLAX for constipation which he will continue.  Comfortable with discharge home.  Dr. Sherry Ruffing has seen.  Discharge home with Foley catheter and urology follow-up.  The patient was  urged to return to the Emergency Department immediately with worsening of current symptoms, worsening abdominal pain, persistent vomiting, blood noted in stools, fever, or any other concerns. The patient verbalized understanding.     MDM Rules/Calculators/A&P                          Urinary retention: Foley catheter placed, no signs of UTI.  Normal kidney function.  Patient will need Foley in place until he can follow-up with urology.  Constipation: Moderate stool noted in rectum on plain films.  Patient does have some abdominal tenderness, normal white blood cell count and other lab work.  Feels better with treatment.  Abdomen is soft without signs of peritonitis.  Do not feel that CT indicated at this point.  Low abdominal pain may be related to bladder spasms from recent retention.  He looks well.  He is comfortable with discharged home with MiraLAX.    Final Clinical Impression(s) / ED Diagnoses Final diagnoses:  Urinary retention  Constipation, unspecified constipation type    Rx / DC Orders ED Discharge Orders    None       Carlisle Cater, PA-C 05/10/20 2231    Tegeler, Gwenyth Allegra, MD 05/11/20 1521

## 2020-05-10 NOTE — ED Notes (Signed)
Bladder scan shows >500cc. PA Layden aware.

## 2020-05-10 NOTE — ED Provider Notes (Signed)
Emergency Medicine Provider Triage Evaluation Note  Steven Harrison 75 y.o. male was evaluated in triage.  Pt complains of urinary retention.  Reports that since 5 AM this morning, he has not been able to urinate.  States he has had some associated abdominal pressure.  He states he has a history of prostate issues.  He states this happened to him once about 25 years ago.  None since.  He reports he has had some constipation n that has been ongoing for last 2 days as well. No numbness/weakness of his arms/legs.   Review of Systems  Positive: Urinary retention.  Negative: Numbness/weakness  Physical Exam  BP 134/82   Pulse 70   Temp 98.2 F (36.8 C) (Oral)   Resp 18   Ht 5\' 4"  (1.626 m)   Wt 65.8 kg   SpO2 100%   BMI 24.89 kg/m  Gen:   Awake, no distress   HEENT:  Atraumatic  Resp:  Normal effort  Cardiac:  Normal rate Abd:   Nondistended, mild tenderness noted. MSK:   Moves extremities without difficulty  Neuro:  Speech clear   Medical Decision Making  Medically screening exam initiated at 3:55 AM.  Appropriate orders placed.  Steven Harrison was informed that the remainder of the evaluation will be completed by another provider, this initial triage assessment does not replace that evaluation, and the importance of remaining in the ED until their evaluation is complete.  Clinical Impression  Urinary retention   Portions of this note were generated with Dragon dictation software. Dictation errors may occur despite best attempts at proofreading.     Volanda Napoleon, PA-C 05/10/20 [place], Elizabeth, DO 05/11/20 0011

## 2020-05-10 NOTE — ED Triage Notes (Signed)
Patient presents to the ED for an evaluation for not being able to urinate, constipation, and a fissure. Patient states the last time he was able to urinate was 0500 this morning, last bowel movement was last Friday.

## 2020-05-11 DIAGNOSIS — K602 Anal fissure, unspecified: Secondary | ICD-10-CM | POA: Diagnosis not present

## 2020-05-12 LAB — URINE CULTURE: Culture: NO GROWTH

## 2020-05-25 DIAGNOSIS — N401 Enlarged prostate with lower urinary tract symptoms: Secondary | ICD-10-CM | POA: Diagnosis not present

## 2020-05-25 DIAGNOSIS — R338 Other retention of urine: Secondary | ICD-10-CM | POA: Diagnosis not present

## 2020-05-28 ENCOUNTER — Other Ambulatory Visit (HOSPITAL_COMMUNITY): Payer: Self-pay

## 2020-05-28 MED FILL — Atorvastatin Calcium Tab 20 MG (Base Equivalent): ORAL | 90 days supply | Qty: 90 | Fill #0 | Status: AC

## 2020-06-02 DIAGNOSIS — H2513 Age-related nuclear cataract, bilateral: Secondary | ICD-10-CM | POA: Diagnosis not present

## 2020-06-02 DIAGNOSIS — H02834 Dermatochalasis of left upper eyelid: Secondary | ICD-10-CM | POA: Diagnosis not present

## 2020-06-02 DIAGNOSIS — H52229 Regular astigmatism, unspecified eye: Secondary | ICD-10-CM | POA: Diagnosis not present

## 2020-06-02 DIAGNOSIS — H02831 Dermatochalasis of right upper eyelid: Secondary | ICD-10-CM | POA: Diagnosis not present

## 2020-06-10 DIAGNOSIS — R3912 Poor urinary stream: Secondary | ICD-10-CM | POA: Diagnosis not present

## 2020-06-10 DIAGNOSIS — N401 Enlarged prostate with lower urinary tract symptoms: Secondary | ICD-10-CM | POA: Diagnosis not present

## 2020-06-10 DIAGNOSIS — N5201 Erectile dysfunction due to arterial insufficiency: Secondary | ICD-10-CM | POA: Diagnosis not present

## 2020-06-10 DIAGNOSIS — R351 Nocturia: Secondary | ICD-10-CM | POA: Diagnosis not present

## 2020-06-28 ENCOUNTER — Other Ambulatory Visit (HOSPITAL_COMMUNITY): Payer: Self-pay

## 2020-06-28 MED ORDER — SILDENAFIL CITRATE 100 MG PO TABS
ORAL_TABLET | ORAL | 3 refills | Status: DC
  Filled 2020-06-28: qty 4, 30d supply, fill #0

## 2020-07-01 ENCOUNTER — Other Ambulatory Visit (HOSPITAL_COMMUNITY): Payer: Self-pay

## 2020-07-14 DIAGNOSIS — Z8719 Personal history of other diseases of the digestive system: Secondary | ICD-10-CM | POA: Diagnosis not present

## 2020-07-28 ENCOUNTER — Other Ambulatory Visit (HOSPITAL_COMMUNITY): Payer: Self-pay

## 2020-10-18 ENCOUNTER — Other Ambulatory Visit: Payer: Self-pay

## 2020-10-18 ENCOUNTER — Ambulatory Visit (HOSPITAL_COMMUNITY)
Admission: EM | Admit: 2020-10-18 | Discharge: 2020-10-18 | Disposition: A | Discharge status: Home / Self Care | Payer: Medicare Other | Attending: Internal Medicine | Admitting: Internal Medicine

## 2020-10-18 ENCOUNTER — Ambulatory Visit (INDEPENDENT_AMBULATORY_CARE_PROVIDER_SITE_OTHER): Payer: Medicare Other

## 2020-10-18 ENCOUNTER — Encounter (HOSPITAL_COMMUNITY): Payer: Self-pay | Admitting: Emergency Medicine

## 2020-10-18 DIAGNOSIS — M7989 Other specified soft tissue disorders: Secondary | ICD-10-CM | POA: Diagnosis not present

## 2020-10-18 DIAGNOSIS — S60222A Contusion of left hand, initial encounter: Secondary | ICD-10-CM

## 2020-10-18 DIAGNOSIS — M79642 Pain in left hand: Secondary | ICD-10-CM

## 2020-10-18 MED ORDER — IBUPROFEN 600 MG PO TABS: 600.0000 mg | ORAL_TABLET | Freq: Three times a day (TID) | ORAL | 0 refills | Status: AC | PRN
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
## 2020-10-18 NOTE — Discharge Instructions (Signed)
Icing of the left hand Elevation of the left hand Tylenol/Motrin as needed for pain X-ray did not show any fractures Return to urgent care if you have any concerns.

## 2020-10-23 DIAGNOSIS — Z23 Encounter for immunization: Secondary | ICD-10-CM | POA: Diagnosis not present

## 2020-10-28 DIAGNOSIS — M7918 Myalgia, other site: Secondary | ICD-10-CM | POA: Diagnosis not present

## 2020-11-16 DIAGNOSIS — R21 Rash and other nonspecific skin eruption: Secondary | ICD-10-CM | POA: Diagnosis not present

## 2020-11-16 DIAGNOSIS — M7918 Myalgia, other site: Secondary | ICD-10-CM | POA: Diagnosis not present

## 2020-11-16 DIAGNOSIS — M65332 Trigger finger, left middle finger: Secondary | ICD-10-CM | POA: Diagnosis not present

## 2020-11-17 DIAGNOSIS — S63642A Sprain of metacarpophalangeal joint of left thumb, initial encounter: Secondary | ICD-10-CM | POA: Diagnosis not present

## 2020-11-17 DIAGNOSIS — M65332 Trigger finger, left middle finger: Secondary | ICD-10-CM | POA: Diagnosis not present

## 2020-11-17 DIAGNOSIS — M79642 Pain in left hand: Secondary | ICD-10-CM | POA: Diagnosis not present

## 2020-11-26 ENCOUNTER — Other Ambulatory Visit (HOSPITAL_COMMUNITY): Payer: Self-pay

## 2020-12-29 DIAGNOSIS — S63642A Sprain of metacarpophalangeal joint of left thumb, initial encounter: Secondary | ICD-10-CM | POA: Diagnosis not present

## 2020-12-29 DIAGNOSIS — M65332 Trigger finger, left middle finger: Secondary | ICD-10-CM | POA: Diagnosis not present

## 2021-02-09 DIAGNOSIS — S63642A Sprain of metacarpophalangeal joint of left thumb, initial encounter: Secondary | ICD-10-CM | POA: Diagnosis not present

## 2021-02-09 DIAGNOSIS — M65332 Trigger finger, left middle finger: Secondary | ICD-10-CM | POA: Diagnosis not present

## 2021-02-15 DIAGNOSIS — M25642 Stiffness of left hand, not elsewhere classified: Secondary | ICD-10-CM | POA: Diagnosis not present

## 2021-02-15 DIAGNOSIS — M65332 Trigger finger, left middle finger: Secondary | ICD-10-CM | POA: Diagnosis not present

## 2021-02-15 DIAGNOSIS — S63642A Sprain of metacarpophalangeal joint of left thumb, initial encounter: Secondary | ICD-10-CM | POA: Diagnosis not present

## 2021-02-15 DIAGNOSIS — M25542 Pain in joints of left hand: Secondary | ICD-10-CM | POA: Diagnosis not present

## 2021-02-24 DIAGNOSIS — M25642 Stiffness of left hand, not elsewhere classified: Secondary | ICD-10-CM | POA: Diagnosis not present

## 2021-02-24 DIAGNOSIS — M65332 Trigger finger, left middle finger: Secondary | ICD-10-CM | POA: Diagnosis not present

## 2021-02-24 DIAGNOSIS — M25542 Pain in joints of left hand: Secondary | ICD-10-CM | POA: Diagnosis not present

## 2021-03-01 DIAGNOSIS — S63642A Sprain of metacarpophalangeal joint of left thumb, initial encounter: Secondary | ICD-10-CM | POA: Diagnosis not present

## 2021-03-01 DIAGNOSIS — M65332 Trigger finger, left middle finger: Secondary | ICD-10-CM | POA: Diagnosis not present

## 2021-03-01 DIAGNOSIS — M25542 Pain in joints of left hand: Secondary | ICD-10-CM | POA: Diagnosis not present

## 2021-03-01 DIAGNOSIS — M25642 Stiffness of left hand, not elsewhere classified: Secondary | ICD-10-CM | POA: Diagnosis not present

## 2021-03-18 DIAGNOSIS — S63642A Sprain of metacarpophalangeal joint of left thumb, initial encounter: Secondary | ICD-10-CM | POA: Diagnosis not present

## 2021-03-18 DIAGNOSIS — R040 Epistaxis: Secondary | ICD-10-CM | POA: Diagnosis not present

## 2021-03-18 DIAGNOSIS — M25642 Stiffness of left hand, not elsewhere classified: Secondary | ICD-10-CM | POA: Diagnosis not present

## 2021-03-18 DIAGNOSIS — M25542 Pain in joints of left hand: Secondary | ICD-10-CM | POA: Diagnosis not present

## 2021-03-23 DIAGNOSIS — M65332 Trigger finger, left middle finger: Secondary | ICD-10-CM | POA: Diagnosis not present

## 2021-03-23 DIAGNOSIS — S63642A Sprain of metacarpophalangeal joint of left thumb, initial encounter: Secondary | ICD-10-CM | POA: Diagnosis not present

## 2021-03-24 DIAGNOSIS — E7800 Pure hypercholesterolemia, unspecified: Secondary | ICD-10-CM | POA: Diagnosis not present

## 2021-03-24 DIAGNOSIS — M542 Cervicalgia: Secondary | ICD-10-CM | POA: Diagnosis not present

## 2021-03-24 DIAGNOSIS — N529 Male erectile dysfunction, unspecified: Secondary | ICD-10-CM | POA: Diagnosis not present

## 2021-03-24 DIAGNOSIS — N400 Enlarged prostate without lower urinary tract symptoms: Secondary | ICD-10-CM | POA: Diagnosis not present

## 2021-03-24 DIAGNOSIS — Z8042 Family history of malignant neoplasm of prostate: Secondary | ICD-10-CM | POA: Diagnosis not present

## 2021-03-24 DIAGNOSIS — I2510 Atherosclerotic heart disease of native coronary artery without angina pectoris: Secondary | ICD-10-CM | POA: Diagnosis not present

## 2021-03-24 DIAGNOSIS — Z0000 Encounter for general adult medical examination without abnormal findings: Secondary | ICD-10-CM | POA: Diagnosis not present

## 2021-05-21 IMAGING — CR DG ABDOMEN 2V
2 series · 3 of 3 positions shown · non-contrast
Comparison: None.

CLINICAL DATA: Constipation urinary retention

EXAM:
ABDOMEN - 2 VIEW

[abdomen erect]
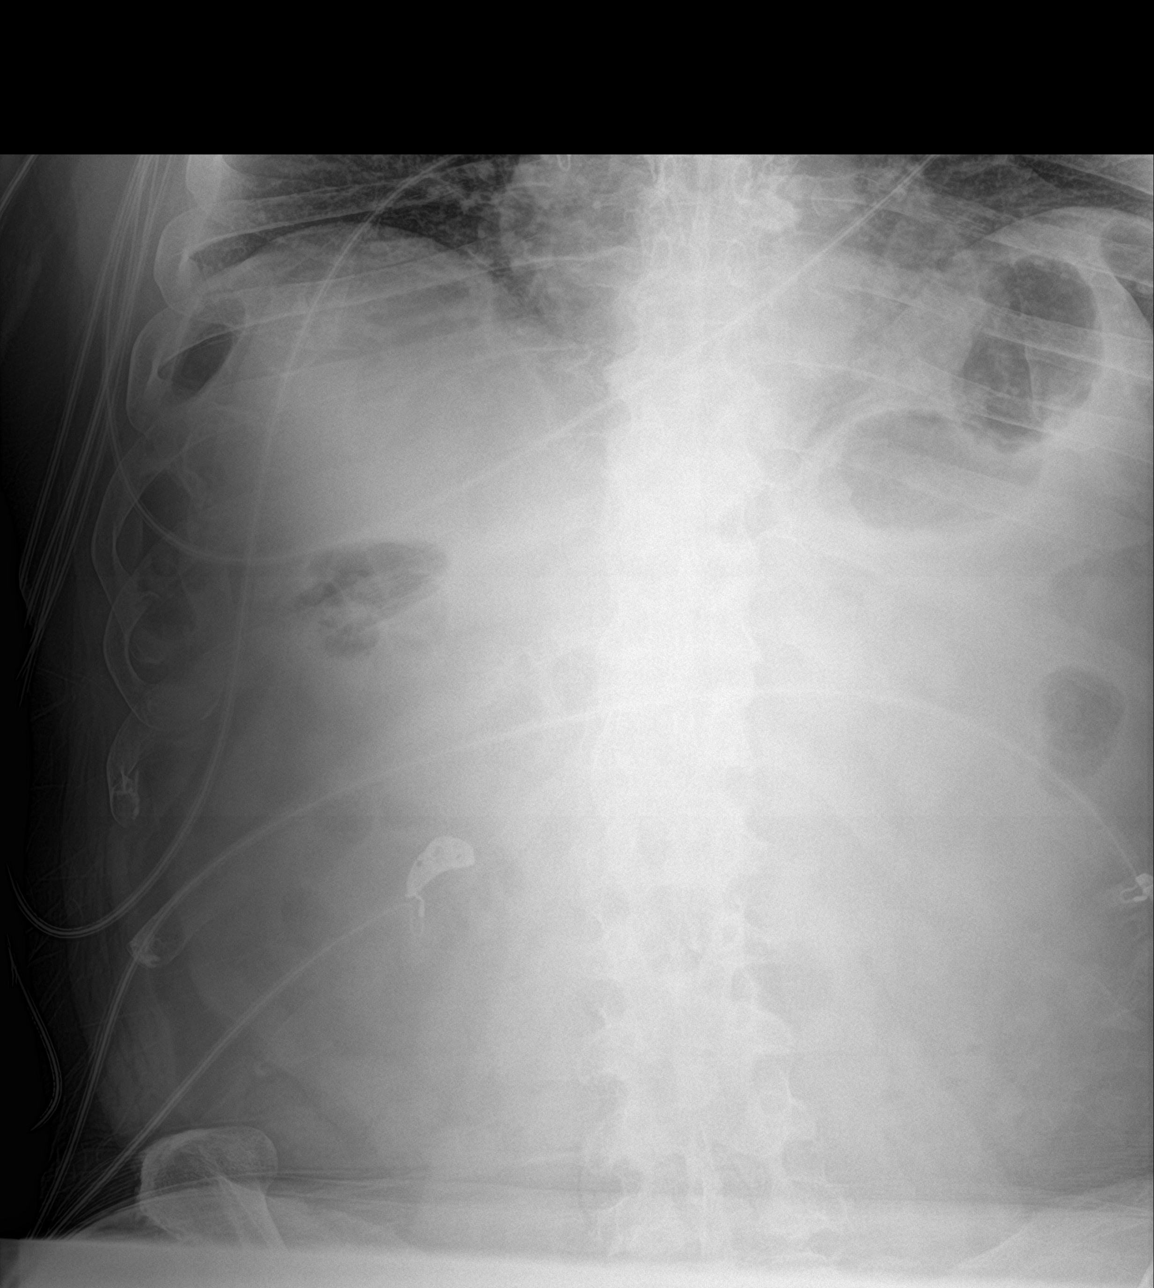

[Series 2: abdomen supine · 0.14mm/px · 2 of 2 slices shown]
[im 1/2]
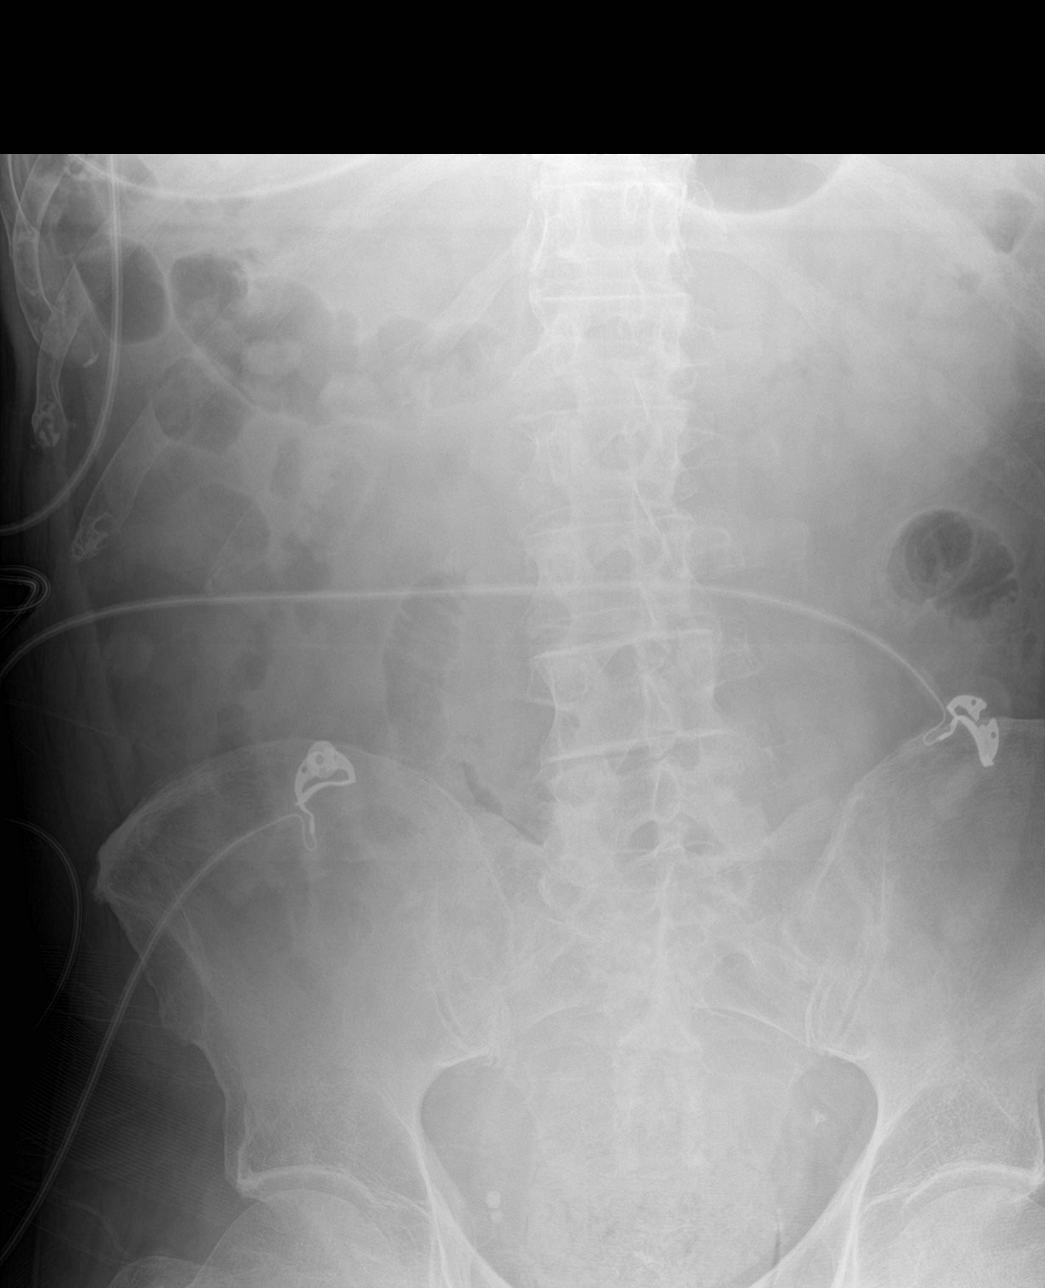
[im 2/2]
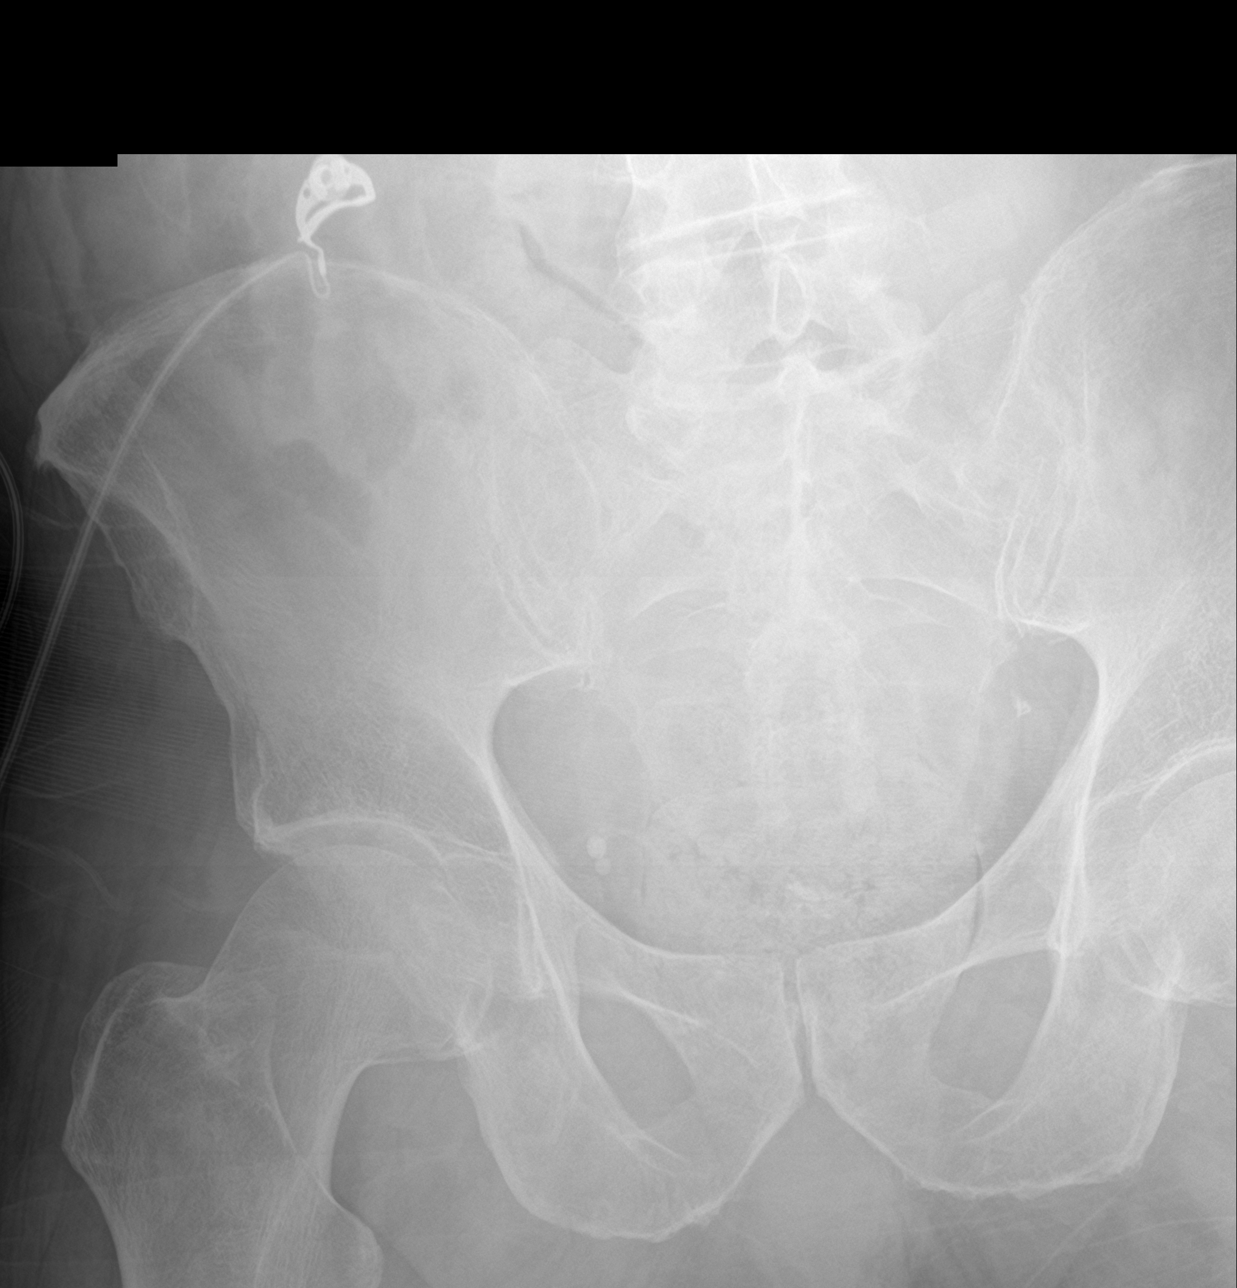

[3 of 3 positions shown; findings below may reference images not displayed]

FINDINGS: Overall nonobstructed gas pattern. Moderate feces at the rectum. No
radiopaque calculi. Lucency at the right upper quadrant likely
high-riding air containing colon.
IMPRESSION: Nonobstructed gas pattern with moderate feces at the rectum

## 2021-06-13 DIAGNOSIS — N5201 Erectile dysfunction due to arterial insufficiency: Secondary | ICD-10-CM | POA: Diagnosis not present

## 2021-06-13 DIAGNOSIS — R3912 Poor urinary stream: Secondary | ICD-10-CM | POA: Diagnosis not present

## 2021-06-13 DIAGNOSIS — R351 Nocturia: Secondary | ICD-10-CM | POA: Diagnosis not present

## 2021-06-13 DIAGNOSIS — N401 Enlarged prostate with lower urinary tract symptoms: Secondary | ICD-10-CM | POA: Diagnosis not present

## 2021-06-15 DIAGNOSIS — H2513 Age-related nuclear cataract, bilateral: Secondary | ICD-10-CM | POA: Diagnosis not present

## 2021-06-15 DIAGNOSIS — H02831 Dermatochalasis of right upper eyelid: Secondary | ICD-10-CM | POA: Diagnosis not present

## 2021-06-15 DIAGNOSIS — H52229 Regular astigmatism, unspecified eye: Secondary | ICD-10-CM | POA: Diagnosis not present

## 2021-06-15 DIAGNOSIS — H02834 Dermatochalasis of left upper eyelid: Secondary | ICD-10-CM | POA: Diagnosis not present

## 2021-07-21 ENCOUNTER — Ambulatory Visit (INDEPENDENT_AMBULATORY_CARE_PROVIDER_SITE_OTHER): Payer: BC Managed Care – PPO | Admitting: Family Medicine

## 2021-07-21 ENCOUNTER — Ambulatory Visit: Payer: Self-pay

## 2021-07-21 VITALS — BP 112/70 | HR 61 | Ht 68.0 in | Wt 195.0 lb

## 2021-07-21 DIAGNOSIS — M65332 Trigger finger, left middle finger: Secondary | ICD-10-CM | POA: Diagnosis not present

## 2021-07-21 DIAGNOSIS — M1812 Unilateral primary osteoarthritis of first carpometacarpal joint, left hand: Secondary | ICD-10-CM

## 2021-07-21 NOTE — Patient Instructions (Signed)
Good to see you! Vit D 2000-4000iu T[place] '1200mg'$  Voltaren Gel Brace at night and with activity See you again in 6-8 weeks
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
## 2021-07-22 DIAGNOSIS — M1812 Unilateral primary osteoarthritis of first carpometacarpal joint, left hand: Secondary | ICD-10-CM | POA: Insufficient documentation

## 2021-07-22 DIAGNOSIS — Z8042 Family history of malignant neoplasm of prostate: Secondary | ICD-10-CM | POA: Insufficient documentation

## 2021-07-22 DIAGNOSIS — N529 Male erectile dysfunction, unspecified: Secondary | ICD-10-CM | POA: Insufficient documentation

## 2021-07-22 DIAGNOSIS — N400 Enlarged prostate without lower urinary tract symptoms: Secondary | ICD-10-CM | POA: Insufficient documentation

## 2021-07-22 DIAGNOSIS — M542 Cervicalgia: Secondary | ICD-10-CM | POA: Insufficient documentation

## 2021-07-22 DIAGNOSIS — G5622 Lesion of ulnar nerve, left upper limb: Secondary | ICD-10-CM | POA: Insufficient documentation

## 2021-07-22 DIAGNOSIS — I2510 Atherosclerotic heart disease of native coronary artery without angina pectoris: Secondary | ICD-10-CM | POA: Insufficient documentation

## 2021-07-22 NOTE — Assessment & Plan Note (Signed)
We will monitor closely.  No significant triggering at the moment.  If continuing to have difficulty we will consider injection.

## 2021-07-22 NOTE — Assessment & Plan Note (Signed)
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  Does have significant IP arthritic changes as well that we will need to consider the possibility of injection there as well.  We discussed not using the steroid too frequently.  Can consider bracing and patient given a thumb spica splint.  Discussed topical anti-inflammatories if needed.  Follow-up with me again in 6 to 8 weeks and we will discuss further treatment options

## 2021-08-10 ENCOUNTER — Ambulatory Visit: Payer: Medicare Other | Admitting: Family Medicine

## 2021-08-19 NOTE — Progress Notes (Unsigned)
[facility] [place] Phone: [phone] Subjective:   Steven Harrison, am serving as a scribe for Dr. Hulan Saas.  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  Does have significant IP arthritic changes as well that we will need to consider the possibility of injection there as well.  We discussed not using the steroid too frequently.  Can consider bracing and patient given a thumb spica splint.  Discussed topical anti-inflammatories if needed.  Follow-up with me again in 6 to 8 weeks and we will discuss further treatment options  Update 08/22/2021 Steven Harrison is a 76 y.o. male coming in with complaint of L thumb and middle finger trigger finger. Patient states that his thumb is improving. Using brace. Little to Harrison pain in thumb.  Trigger finger has not improved.        Past Medical History:  Diagnosis Date   Cancer ([place])    SKIN CANCER EAR AND FOREHEAD , FROZEN OFF   Coronary artery disease    coronary stent - 1999 - released by carsdiologist several years ago per patient , 06-24-2018 DENIES ACUTE CARDIAC SX    Hemorrhoids    Past Surgical History:  Procedure Laterality Date   COLONOSCOPY N/A 12/13/2017   Procedure: COLONOSCOPY;  Surgeon: Ileana Roup, MD;  Location: [facility];  Service: General;  Laterality: N/A;   COLONOSCOPY WITH PROPOFOL N/A 02/08/2016   Procedure: COLONOSCOPY WITH PROPOFOL;  Surgeon: Garlan Fair, MD;  Location: [facility];  Service: Endoscopy;  Laterality: N/A;   HEMORRHOID SURGERY N/A 06/26/2018   Procedure: HEMORRHOIDECTOMY;  Surgeon: Ileana Roup, MD;  [facility];  Service: General;  Laterality: N/A;   Harrison PAST  SURGERIES     Patient had a stent palced in 1999   POLYPECTOMY  12/13/2017   Procedure: POLYPECTOMY;  Surgeon: Ileana Roup, MD;  Location: [facility];  Service: General;;   RECTAL EXAM UNDER ANESTHESIA N/A 06/26/2018   Procedure: ANORECTAL EXAM UNDER ANESTHESIA;  Surgeon: Ileana Roup, MD;  Location: [facility];  Service: General;  Laterality: N/A;   Social History   Socioeconomic History   Marital status: Married    Spouse name: Not on file   Number of children: Not on file   Years of education: Not on file   Highest education level: Not on file  Occupational History   Not on file  Tobacco Use   Smoking status: Never   Smokeless tobacco: Never  Vaping Use   Vaping Use: Never used  Substance and Sexual Activity   Alcohol use: Harrison   Drug use: Harrison   Sexual activity: Not on file  Other Topics Concern   Not on file  Social History Narrative   Not on file   Social Determinants of Health   Financial Resource Strain: Not on file  Food Insecurity: Not on file  Transportation Needs: Not on file  Physical Activity: Not on file  Stress: Not on file  Social Connections: Not on file   Harrison Known Allergies Harrison family history on file.   Current Outpatient Medications (Cardiovascular):    atorvastatin (LIPITOR) 20 MG tablet, Take 20 mg by mouth daily.    atorvastatin (  LIPITOR) 20 MG tablet, TAKE ONE (1) TABLET BY MOUTH EVERY DAY   sildenafil (VIAGRA) 100 MG tablet, Take 100 mg by mouth daily as needed for erectile dysfunction. 4 per month   sildenafil (VIAGRA) 100 MG tablet, TAKE 1 TABLET AS NEEDED AS DIRECTED FOR SEXUAL FUNCTION. 270   sildenafil (VIAGRA) 100 MG tablet, TAKE 1 TABLET AS NEEDED AS DIRECTED FOR SEXUAL FUNCTION.   sildenafil (VIAGRA) 100 MG tablet, TAKE ONE (1) TABLET BY MOUTH ONE TIME AS DIRECTED  Current Outpatient Medications (Respiratory):    sodium chloride (OCEAN) 0.65 % SOLN nasal spray, Place 1 spray into both nostrils as needed  (dryness).  Current Outpatient Medications (Analgesics):    ibuprofen (ADVIL) 600 MG tablet, Take 1 tablet (600 mg total) by mouth every 8 (eight) hours as needed for moderate pain.  Current Outpatient Medications (Hematological):    cyanocobalamin 1000 MCG tablet, Take 1,000 mcg by mouth daily.  Current Outpatient Medications (Other):    Menthol-Zinc Oxide (CALMOSEPTINE EX), Apply 1 application topically daily as needed (irritation).   Multiple Vitamin (MULTIVITAMIN WITH MINERALS) TABS tablet, Take 1 tablet by mouth daily.   Multiple Vitamins-Minerals (PRESERVISION AREDS 2 PO), Take by mouth daily.   polyethylene glycol (MIRALAX / GLYCOLAX) 17 g packet, Take 17 g by mouth daily as needed.   Wheat Dextrin (BENEFIBER) POWD, Take by mouth as needed.   Reviewed prior external information including notes and imaging from  primary care provider As well as notes that were available from care everywhere and other healthcare systems.  Past medical history, social, surgical and family history all reviewed in electronic medical record.  Harrison pertanent information unless stated regarding to the chief complaint.   Review of Systems:  Harrison headache, visual changes, nausea, vomiting, diarrhea, constipation, dizziness, abdominal pain, skin rash, fevers, chills, night sweats, weight loss, swollen lymph nodes, body aches, joint swelling, chest pain, shortness of breath, mood changes. POSITIVE muscle aches  Objective  Blood pressure 92/60, pulse (!) 53, height '5\' 8"'$  (1.727 m), weight 191 lb (86.6 kg), SpO2 97 %.   General: Harrison apparent distress alert and oriented x3 mood and affect normal, dressed appropriately.  HEENT: Pupils equal, extraocular movements intact  Respiratory: Patient's speak in full sentences and does not appear short of breath  Cardiovascular: Harrison lower extremity edema, non tender, Harrison erythema  Left thumb does have the arthritic changes noted.  Positive crepitus.  Positive grind test  noted.  Patient does have triggering at the A2 pulley noted of the middle finger.  Severely tender to palpation in the proximal aspect of the MCP.  Procedure: Real-time Ultrasound Guided Injection of left flexor tendon sheath of the middle finger Device: GE Logiq Q7 Ultrasound guided injection is preferred based studies that show increased duration, increased effect, greater accuracy, decreased procedural pain, increased response rate, and decreased cost with ultrasound guided versus blind injection.  Verbal informed consent obtained.  Time-out conducted.  Noted Harrison overlying erythema, induration, or other signs of local infection.  Skin prepped in a sterile fashion.  Local anesthesia: Topical Ethyl chloride.  With sterile technique and under real time ultrasound guidance: With a 25-gauge half inch needle injecting 0.5 cc of 0.5% Marcaine and 0.5 cc of Kenalog 40 mg/mL. Completed without difficulty  Pain immediately resolved suggesting accurate placement of the medication.  Advised to call if fevers/chills, erythema, induration, drainage, or persistent bleeding.  Impression: Technically successful ultrasound guided injection.     Impression and Recommendations:    The above  documentation has been reviewed and is accurate and complete Lyndal Pulley, DO

## 2021-08-22 ENCOUNTER — Encounter: Payer: Self-pay | Admitting: Family Medicine

## 2021-08-22 ENCOUNTER — Ambulatory Visit: Payer: Self-pay

## 2021-08-22 ENCOUNTER — Ambulatory Visit (INDEPENDENT_AMBULATORY_CARE_PROVIDER_SITE_OTHER): Payer: BC Managed Care – PPO | Admitting: Family Medicine

## 2021-08-22 VITALS — BP 92/60 | HR 53 | Ht 68.0 in | Wt 191.0 lb

## 2021-08-22 DIAGNOSIS — G8929 Other chronic pain: Secondary | ICD-10-CM

## 2021-08-22 DIAGNOSIS — M79645 Pain in left finger(s): Secondary | ICD-10-CM | POA: Diagnosis not present

## 2021-08-22 DIAGNOSIS — M65332 Trigger finger, left middle finger: Secondary | ICD-10-CM

## 2021-08-22 NOTE — Assessment & Plan Note (Signed)
Chronic problem with worsening symptoms.  Had more triggering noted today.  Patient given injection and tolerated the procedure well.  Discussed icing regimen and home exercises.  Increase activity slowly.  Follow-up again in 6 to 8 weeks.

## 2021-08-22 NOTE — Patient Instructions (Signed)
Injected trigger finger We will watch the thumb See me in 6 weeks

## 2021-09-29 NOTE — Progress Notes (Signed)
  [name] Phone: [phone] Subjective:   [name], am serving as a scribe for Dr. Hulan Saas.  I'm seeing this patient by the request  of:  Collene Leyden, MD  CC: Left thumb and middle finger  [facility]:[id number]  08/22/2021 Chronic problem with worsening symptoms.  Had more triggering noted today.  Patient given injection and tolerated the procedure well.  Discussed icing regimen and home exercises.  Increase activity slowly.  Follow-up again in 6 to 8 weeks.  Update 10/03/2021 Steven Harrison is a 76 y.o. male coming in with complaint of L thumb arthritis and L middle finger, triggering. Patient states that he has had improvement since last visit. Minimal triggering in which he will use the brace to decrease symptoms. Thumb is also doing much better.        Past Medical History:  Diagnosis Date   Cancer ([place])    SKIN CANCER EAR AND FOREHEAD , FROZEN OFF   Coronary artery disease    coronary stent - 1999 - released by carsdiologist several years ago per patient , 06-24-2018 DENIES ACUTE CARDIAC SX    Hemorrhoids    Past Surgical History:  Procedure Laterality Date   COLONOSCOPY N/A 12/13/2017   Procedure: COLONOSCOPY;  Surgeon: Ileana Roup, MD;  Location: [facility];  Service: General;  Laterality: N/A;   COLONOSCOPY WITH PROPOFOL N/A 02/08/2016   Procedure: COLONOSCOPY WITH PROPOFOL;  Surgeon: Garlan Fair, MD;  Location: [facility];  Service: Endoscopy;  Laterality: N/A;   HEMORRHOID SURGERY N/A 06/26/2018   Procedure: HEMORRHOIDECTOMY;  Surgeon: Ileana Roup, MD;  Location: [facility];  Service: General;  Laterality: N/A;   NO PAST SURGERIES     Patient had a stent palced in 1999   POLYPECTOMY  12/13/2017   Procedure: POLYPECTOMY;  Surgeon: Ileana Roup, MD;  Location: [facility];  Service: General;;   RECTAL EXAM UNDER ANESTHESIA N/A 06/26/2018    Procedure: ANORECTAL EXAM UNDER ANESTHESIA;  Surgeon: Ileana Roup, MD;  Location: [facility];  Service: General;  Laterality: N/A;      Objective  Blood pressure 114/72, pulse (!) 49, height '5\' 8"'$  (1.727 m), weight 193 lb (87.5 kg), SpO2 99 %.   General: No apparent distress alert and oriented x3 mood and affect normal, dressed appropriately.  HEENT: Pupils equal, extraocular movements intact  Respiratory: Patient's speak in full sentences and does not appear short of breath  Cardiovascular: No lower extremity edema, non tender, no erythema   Still has arthritic changes of the [facility] joint but nontender.  The patient does have audible clicking noted of the thumb and very mild tenderness at the A2 pulley but no true locking noted.  Middle finger completely improvement with no locking at this point.   Impression and Recommendations:     The above documentation has been reviewed and is accurate and complete Lyndal Pulley, DO

## 2021-10-03 ENCOUNTER — Ambulatory Visit (INDEPENDENT_AMBULATORY_CARE_PROVIDER_SITE_OTHER): Payer: BC Managed Care – PPO | Admitting: Family Medicine

## 2021-10-03 ENCOUNTER — Ambulatory Visit: Payer: Self-pay

## 2021-10-03 VITALS — BP 114/72 | HR 49 | Ht 68.0 in | Wt 193.0 lb

## 2021-10-03 DIAGNOSIS — M1812 Unilateral primary osteoarthritis of first carpometacarpal joint, left hand: Secondary | ICD-10-CM | POA: Diagnosis not present

## 2021-10-03 DIAGNOSIS — M79642 Pain in left hand: Secondary | ICD-10-CM

## 2021-10-03 NOTE — Assessment & Plan Note (Signed)
Patient is doing significantly better at this time.  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  Patient can follow-up well as needed

## 2021-10-08 DIAGNOSIS — Z23 Encounter for immunization: Secondary | ICD-10-CM | POA: Diagnosis not present
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
## 2022-03-27 DIAGNOSIS — I2510 Atherosclerotic heart disease of native coronary artery without angina pectoris: Secondary | ICD-10-CM | POA: Diagnosis not present

## 2022-03-27 DIAGNOSIS — N529 Male erectile dysfunction, unspecified: Secondary | ICD-10-CM | POA: Diagnosis not present

## 2022-03-27 DIAGNOSIS — Z0000 Encounter for general adult medical examination without abnormal findings: Secondary | ICD-10-CM | POA: Diagnosis not present

## 2022-03-27 DIAGNOSIS — Z8042 Family history of malignant neoplasm of prostate: Secondary | ICD-10-CM | POA: Diagnosis not present

## 2022-03-27 DIAGNOSIS — N400 Enlarged prostate without lower urinary tract symptoms: Secondary | ICD-10-CM | POA: Diagnosis not present

## 2022-03-27 DIAGNOSIS — E7800 Pure hypercholesterolemia, unspecified: Secondary | ICD-10-CM | POA: Diagnosis not present

## 2022-05-29 ENCOUNTER — Other Ambulatory Visit: Payer: Self-pay

## 2022-05-29 ENCOUNTER — Ambulatory Visit (INDEPENDENT_AMBULATORY_CARE_PROVIDER_SITE_OTHER): Payer: BC Managed Care – PPO | Admitting: Family Medicine

## 2022-05-29 ENCOUNTER — Ambulatory Visit (INDEPENDENT_AMBULATORY_CARE_PROVIDER_SITE_OTHER): Payer: BC Managed Care – PPO

## 2022-05-29 VITALS — BP 140/82 | HR 52 | Ht 68.0 in | Wt 194.0 lb

## 2022-05-29 DIAGNOSIS — M79642 Pain in left hand: Secondary | ICD-10-CM

## 2022-05-29 DIAGNOSIS — M1811 Unilateral primary osteoarthritis of first carpometacarpal joint, right hand: Secondary | ICD-10-CM

## 2022-05-29 NOTE — Patient Instructions (Addendum)
Thank you for coming in today.   Please get an Xray today before you leave   You received an injection today. Seek immediate medical attention if the joint becomes red, extremely painful, or is oozing fluid.   I've referred you to Physical Therapy.  Let us know if you don't hear from them in one week.   Check back in 6 weeks   

## 2022-05-29 NOTE — Progress Notes (Unsigned)
Steven Payor, PhD, LAT, ATC acting as a scribe for Clementeen Graham, MD.  Steven Harrison is a 77 y.o. male who presents to [facility] at [facility] today for L hand pain. Pt was last seen by Dr. Katrinka Blazing on 10/03/21 and was given a L 3rd trigger injection on 08/22/20.   Today, pt reports injury to his L hand/wrist about 1-2 wks ago. He was laying on the floor putting door sweeps on, pronated his L wrist and felt a "pop/crunch." Pt locates pain to the L 1st CMC joint through radial aspect of the wrist and forearm.   Swelling: yes Paresthesia: no Grip strength: diminished Aggravates: pronation/supination, radial deviation Treatments tried: thumb spica splint, Voltaren gel, Tylenol  Dx imaging: 10/18/20 L hand XR  Pertinent review of systems: No fevers or chills  Relevant historical information: History of trigger finger left hand.   Exam:  BP (!) 140/82   Pulse (!) 52   Ht 5\' 8"  (1.727 m)   Wt 194 lb (88 kg)   SpO2 98%   BMI 29.50 kg/m  General: Well Developed, well nourished, and in no acute distress.   MSK: Left wrist swollen at radial aspect of wrist.  Tender palpation first CMC.  Pain present with thumb motion and wrist motion. Pulses cap refill and sensation are intact distally.  Strength is intact.    Lab and Radiology Results  Procedure: Real-time Ultrasound Guided Injection of left first [facility] Device: Philips Affiniti 50G Images permanently stored and available for review in PACS Ultrasound evaluation prior to injection reveals joint effusion at first [facility] and tenosynovitis first dorsal wrist compartment. Verbal informed consent obtained.  Discussed risks and benefits of procedure. Warned about infection, bleeding, hyperglycemia damage to structures among others. Patient expresses understanding and agreement Time-out conducted.   Noted no overlying erythema, induration, or other signs of local infection.   Skin prepped in a sterile fashion.   Local  anesthesia: Topical Ethyl chloride.   With sterile technique and under real time ultrasound guidance: 20 mg of Kenalog and 0.5 mL of lidocaine injected into first CMC. Fluid seen entering the joint capsule.   Completed without difficulty   Pain immediately resolved suggesting accurate placement of the medication.   Advised to call if fevers/chills, erythema, induration, drainage, or persistent bleeding.   Images permanently stored and available for review in the ultrasound unit.  Impression: Technically successful ultrasound guided injection.    X-ray images left hand obtained today personally and independently interpreted DJD first CMC.  No acute fractures.  Tiny old avulsion from ulnar styloid present. Will await formal radiology review     Assessment and Plan: 77 y.o. male with left wrist and hand pain.  Pain thought to be primarily due to exacerbation of DJD.  There may be a ligament or tendon injury as well.  Plan for injection as above and trial of occupational therapy. Recheck in 6 weeks.  If worsening or not improving could consider MRI arthrogram at some point.  PDMP not reviewed this encounter. Orders Placed This Encounter  Procedures   [place] LIMITED JOINT SPACE STRUCTURES UP LEFT(NO LINKED CHARGES)    Order Specific Question:   Reason for Exam (SYMPTOM  OR DIAGNOSIS REQUIRED)    Answer:   left hand pain    Order Specific Question:   Preferred imaging location?    Answer:   Adult nurse [facility] Hand Complete Left    Standing Status:  Future    Number of Occurrences:   1    Standing Expiration Date:   06/29/2022    Order Specific Question:   Reason for Exam (SYMPTOM  OR DIAGNOSIS REQUIRED)    Answer:   left hand pain    Order Specific Question:   Preferred imaging location?    Answer:   [facility]   Ambulatory referral to Occupational Therapy    Referral Priority:   Routine    Referral Type:   Occupational Therapy    Referral Reason:    Specialty Services Required    Requested Specialty:   Occupational Therapy    Number of Visits Requested:   1   No orders of the defined types were placed in this encounter.    Discussed warning signs or symptoms. Please see discharge instructions. Patient expresses understanding.   The above documentation has been reviewed and is accurate and complete Clementeen Graham, M.D.
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
## 2022-06-01 ENCOUNTER — Encounter: Payer: Self-pay | Admitting: Rehabilitative and Restorative Service Providers"

## 2022-06-01 ENCOUNTER — Other Ambulatory Visit: Payer: Self-pay

## 2022-06-01 ENCOUNTER — Ambulatory Visit (INDEPENDENT_AMBULATORY_CARE_PROVIDER_SITE_OTHER): Payer: BC Managed Care – PPO | Admitting: Rehabilitative and Restorative Service Providers"

## 2022-06-01 DIAGNOSIS — M25542 Pain in joints of left hand: Secondary | ICD-10-CM

## 2022-06-01 DIAGNOSIS — R600 Localized edema: Secondary | ICD-10-CM

## 2022-06-01 DIAGNOSIS — M25632 Stiffness of left wrist, not elsewhere classified: Secondary | ICD-10-CM

## 2022-06-01 DIAGNOSIS — R278 Other lack of coordination: Secondary | ICD-10-CM | POA: Diagnosis not present

## 2022-06-01 DIAGNOSIS — M6281 Muscle weakness (generalized): Secondary | ICD-10-CM

## 2022-06-01 DIAGNOSIS — M25532 Pain in left wrist: Secondary | ICD-10-CM | POA: Diagnosis not present

## 2022-06-01 NOTE — Progress Notes (Signed)
No fractures are present.  Your arthritis is worse at the base of the thumb and at the index finger.
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
## 2022-06-16 ENCOUNTER — Encounter: Payer: Self-pay | Admitting: Rehabilitative and Restorative Service Providers"

## 2022-06-16 ENCOUNTER — Ambulatory Visit (INDEPENDENT_AMBULATORY_CARE_PROVIDER_SITE_OTHER): Payer: BC Managed Care – PPO | Admitting: Rehabilitative and Restorative Service Providers"

## 2022-06-16 DIAGNOSIS — M25532 Pain in left wrist: Secondary | ICD-10-CM

## 2022-06-16 DIAGNOSIS — M6281 Muscle weakness (generalized): Secondary | ICD-10-CM | POA: Diagnosis not present

## 2022-06-16 DIAGNOSIS — M25632 Stiffness of left wrist, not elsewhere classified: Secondary | ICD-10-CM | POA: Diagnosis not present

## 2022-06-16 DIAGNOSIS — R278 Other lack of coordination: Secondary | ICD-10-CM

## 2022-06-16 DIAGNOSIS — M25542 Pain in joints of left hand: Secondary | ICD-10-CM | POA: Diagnosis not present

## 2022-06-16 DIAGNOSIS — R600 Localized edema: Secondary | ICD-10-CM

## 2022-06-20 DIAGNOSIS — H52229 Regular astigmatism, unspecified eye: Secondary | ICD-10-CM | POA: Diagnosis not present

## 2022-06-20 DIAGNOSIS — H02834 Dermatochalasis of left upper eyelid: Secondary | ICD-10-CM | POA: Diagnosis not present

## 2022-06-20 DIAGNOSIS — H02831 Dermatochalasis of right upper eyelid: Secondary | ICD-10-CM | POA: Diagnosis not present

## 2022-06-20 DIAGNOSIS — H2513 Age-related nuclear cataract, bilateral: Secondary | ICD-10-CM | POA: Diagnosis not present
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
## 2022-06-23 ENCOUNTER — Encounter: Payer: Self-pay | Admitting: Rehabilitative and Restorative Service Providers"

## 2022-06-23 ENCOUNTER — Ambulatory Visit (INDEPENDENT_AMBULATORY_CARE_PROVIDER_SITE_OTHER): Payer: BC Managed Care – PPO | Admitting: Rehabilitative and Restorative Service Providers"

## 2022-06-23 DIAGNOSIS — M6281 Muscle weakness (generalized): Secondary | ICD-10-CM | POA: Diagnosis not present

## 2022-06-23 DIAGNOSIS — R278 Other lack of coordination: Secondary | ICD-10-CM | POA: Diagnosis not present

## 2022-06-23 DIAGNOSIS — R600 Localized edema: Secondary | ICD-10-CM | POA: Diagnosis not present

## 2022-06-23 DIAGNOSIS — M25542 Pain in joints of left hand: Secondary | ICD-10-CM | POA: Diagnosis not present

## 2022-06-23 DIAGNOSIS — M25532 Pain in left wrist: Secondary | ICD-10-CM

## 2022-06-23 DIAGNOSIS — M25632 Stiffness of left wrist, not elsewhere classified: Secondary | ICD-10-CM
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
## 2022-06-29 ENCOUNTER — Ambulatory Visit (INDEPENDENT_AMBULATORY_CARE_PROVIDER_SITE_OTHER): Payer: BC Managed Care – PPO | Admitting: Rehabilitative and Restorative Service Providers"

## 2022-06-29 ENCOUNTER — Encounter: Payer: Self-pay | Admitting: Rehabilitative and Restorative Service Providers"

## 2022-06-29 DIAGNOSIS — R278 Other lack of coordination: Secondary | ICD-10-CM | POA: Diagnosis not present

## 2022-06-29 DIAGNOSIS — R600 Localized edema: Secondary | ICD-10-CM

## 2022-06-29 DIAGNOSIS — M25632 Stiffness of left wrist, not elsewhere classified: Secondary | ICD-10-CM | POA: Diagnosis not present

## 2022-06-29 DIAGNOSIS — M6281 Muscle weakness (generalized): Secondary | ICD-10-CM | POA: Diagnosis not present

## 2022-06-29 DIAGNOSIS — M25542 Pain in joints of left hand: Secondary | ICD-10-CM

## 2022-06-29 DIAGNOSIS — M25532 Pain in left wrist: Secondary | ICD-10-CM | POA: Diagnosis not present

## 2022-06-30 DIAGNOSIS — R351 Nocturia: Secondary | ICD-10-CM | POA: Diagnosis not present

## 2022-06-30 DIAGNOSIS — N5201 Erectile dysfunction due to arterial insufficiency: Secondary | ICD-10-CM | POA: Diagnosis not present

## 2022-06-30 DIAGNOSIS — N401 Enlarged prostate with lower urinary tract symptoms: Secondary | ICD-10-CM | POA: Diagnosis not present

## 2022-06-30 DIAGNOSIS — R3912 Poor urinary stream: Secondary | ICD-10-CM | POA: Diagnosis not present
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
## 2022-07-06 ENCOUNTER — Ambulatory Visit (INDEPENDENT_AMBULATORY_CARE_PROVIDER_SITE_OTHER): Payer: BC Managed Care – PPO | Admitting: Rehabilitative and Restorative Service Providers"

## 2022-07-06 ENCOUNTER — Encounter: Payer: Self-pay | Admitting: Rehabilitative and Restorative Service Providers"

## 2022-07-06 DIAGNOSIS — R278 Other lack of coordination: Secondary | ICD-10-CM | POA: Diagnosis not present

## 2022-07-06 DIAGNOSIS — R600 Localized edema: Secondary | ICD-10-CM

## 2022-07-06 DIAGNOSIS — M25532 Pain in left wrist: Secondary | ICD-10-CM

## 2022-07-06 DIAGNOSIS — M25632 Stiffness of left wrist, not elsewhere classified: Secondary | ICD-10-CM

## 2022-07-06 DIAGNOSIS — M6281 Muscle weakness (generalized): Secondary | ICD-10-CM

## 2022-07-06 DIAGNOSIS — M25542 Pain in joints of left hand: Secondary | ICD-10-CM

## 2022-07-10 ENCOUNTER — Other Ambulatory Visit: Payer: Self-pay

## 2022-07-10 ENCOUNTER — Ambulatory Visit: Payer: BC Managed Care – PPO | Admitting: Family Medicine

## 2022-07-10 VITALS — BP 106/64 | HR 68 | Ht 68.0 in | Wt 193.0 lb

## 2022-07-10 DIAGNOSIS — M79642 Pain in left hand: Secondary | ICD-10-CM | POA: Diagnosis not present

## 2022-07-10 DIAGNOSIS — M65332 Trigger finger, left middle finger: Secondary | ICD-10-CM | POA: Diagnosis not present

## 2022-07-10 NOTE — Patient Instructions (Addendum)
Thank you for coming in today.   You received an injection today. Seek immediate medical attention if the joint becomes red, extremely painful, or is oozing fluid.  

## 2022-07-10 NOTE — Progress Notes (Signed)
   Steven Payor, PhD, LAT, ATC acting as a scribe for Clementeen Graham, MD.  Steven Harrison is a 77 y.o. male who presents to [facility] at [facility] today for 6-wk f/u L hand pain. Pt was last seen by Steven Harrison on 05/29/22 and was given a L 1st CMC steroid injection and was referred to OT, completing 5 visits (d/c on June 27th).  Today, pt reports L hand is feeling pretty good. He notes some soreness, esp in the morning. He does have one pinpointed spot of tenderness over the 1st [facility] joint. He notes the L 3rd finger is triggering.   Dx imaging: 05/29/22 L hand XR 10/18/20 L hand XR   Pertinent review of systems: No fevers or chills  Relevant historical information: Prior left third digit trigger finger with injection most recently September 2023 by my partner Steven Harrison.   Exam:  BP 106/64   Pulse 68   Ht 5\' 8"  (1.727 m)   Wt 193 lb (87.5 kg)   SpO2 98%   BMI 29.35 kg/m  General: Well Developed, well nourished, and in no acute distress.   MSK: Left hand bossing at first [facility].  Mildly tender palpation third palmar MCP.  Some triggering is present with flexion of the PIP joint third digit.    Lab and Radiology Results  Procedure: Real-time Ultrasound Guided Injection of left third A1 pulley tendon sheath (trigger finger injection) Device: Philips Affiniti 50G/GE Logiq Images permanently stored and available for review in PACS Verbal informed consent obtained.  Discussed risks and benefits of procedure. Warned about infection, bleeding, hyperglycemia damage to structures among others. Patient expresses understanding and agreement Time-out conducted.   Noted no overlying erythema, induration, or other signs of local infection.   Skin prepped in a sterile fashion.   Local anesthesia: Topical Ethyl chloride.   With sterile technique and under real time ultrasound guidance: 40 mg of Kenalog and 1 mL of lidocaine injected into tendon sheath at A1 pulley. Fluid seen  entering the tendon sheath.   Completed without difficulty   Pain immediately resolved suggesting accurate placement of the medication.   Advised to call if fevers/chills, erythema, induration, drainage, or persistent bleeding.   Images permanently stored and available for review in the ultrasound unit.  Impression: Technically successful ultrasound guided injection.         Assessment and Plan: 77 y.o. male with right trigger finger.  Plan for injection today.  Previous injection was September 2023.  He has done hand therapy recently which has been helpful.  Check back as needed.  Happy to refer to hand surgery if needed.   PDMP not reviewed this encounter. Orders Placed This Encounter  Procedures   [place] LIMITED JOINT SPACE STRUCTURES UP LEFT(NO LINKED CHARGES)    Order Specific Question:   Reason for Exam (SYMPTOM  OR DIAGNOSIS REQUIRED)    Answer:   left hand pain    Order Specific Question:   Preferred imaging location?    Answer:   [facility]   No orders of the defined types were placed in this encounter.    Discussed warning signs or symptoms. Please see discharge instructions. Patient expresses understanding.   The above documentation has been reviewed and is accurate and complete Clementeen Graham, M.D.

## 2022-08-02 DIAGNOSIS — H04123 Dry eye syndrome of bilateral lacrimal glands: Secondary | ICD-10-CM | POA: Diagnosis not present

## 2022-08-02 DIAGNOSIS — H2513 Age-related nuclear cataract, bilateral: Secondary | ICD-10-CM | POA: Diagnosis not present

## 2022-09-23 DIAGNOSIS — Z23 Encounter for immunization: Secondary | ICD-10-CM | POA: Diagnosis not present

## 2023-02-09 DIAGNOSIS — M545 Low back pain, unspecified: Secondary | ICD-10-CM | POA: Diagnosis not present

## 2023-02-09 DIAGNOSIS — L309 Dermatitis, unspecified: Secondary | ICD-10-CM | POA: Diagnosis not present

## 2023-03-27 DIAGNOSIS — E7800 Pure hypercholesterolemia, unspecified: Secondary | ICD-10-CM | POA: Diagnosis not present

## 2023-03-27 DIAGNOSIS — Z8042 Family history of malignant neoplasm of prostate: Secondary | ICD-10-CM | POA: Diagnosis not present

## 2023-03-27 DIAGNOSIS — I2510 Atherosclerotic heart disease of native coronary artery without angina pectoris: Secondary | ICD-10-CM | POA: Diagnosis not present

## 2023-03-28 DIAGNOSIS — Z23 Encounter for immunization: Secondary | ICD-10-CM | POA: Diagnosis not present

## 2023-03-28 DIAGNOSIS — N400 Enlarged prostate without lower urinary tract symptoms: Secondary | ICD-10-CM | POA: Diagnosis not present

## 2023-03-28 DIAGNOSIS — I2510 Atherosclerotic heart disease of native coronary artery without angina pectoris: Secondary | ICD-10-CM | POA: Diagnosis not present

## 2023-03-28 DIAGNOSIS — N529 Male erectile dysfunction, unspecified: Secondary | ICD-10-CM | POA: Diagnosis not present

## 2023-03-28 DIAGNOSIS — Z0000 Encounter for general adult medical examination without abnormal findings: Secondary | ICD-10-CM | POA: Diagnosis not present

## 2023-03-28 DIAGNOSIS — E7800 Pure hypercholesterolemia, unspecified: Secondary | ICD-10-CM | POA: Diagnosis not present

## 2023-05-10 ENCOUNTER — Other Ambulatory Visit: Payer: Self-pay | Admitting: Family Medicine

## 2023-05-10 ENCOUNTER — Ambulatory Visit
Admission: RE | Admit: 2023-05-10 | Discharge: 2023-05-10 | Disposition: A | Discharge status: Home / Self Care | Source: Ambulatory Visit | Attending: Family Medicine | Admitting: Family Medicine

## 2023-05-10 DIAGNOSIS — R059 Cough, unspecified: Secondary | ICD-10-CM

## 2023-05-10 DIAGNOSIS — R079 Chest pain, unspecified: Secondary | ICD-10-CM | POA: Diagnosis not present

## 2023-05-15 DIAGNOSIS — R3912 Poor urinary stream: Secondary | ICD-10-CM | POA: Diagnosis not present

## 2023-05-15 DIAGNOSIS — N5201 Erectile dysfunction due to arterial insufficiency: Secondary | ICD-10-CM | POA: Diagnosis not present

## 2023-05-15 DIAGNOSIS — R059 Cough, unspecified: Secondary | ICD-10-CM | POA: Diagnosis not present

## 2023-05-15 DIAGNOSIS — N401 Enlarged prostate with lower urinary tract symptoms: Secondary | ICD-10-CM | POA: Diagnosis not present

## 2023-05-15 DIAGNOSIS — R351 Nocturia: Secondary | ICD-10-CM | POA: Diagnosis not present

## 2023-07-04 DIAGNOSIS — H02831 Dermatochalasis of right upper eyelid: Secondary | ICD-10-CM | POA: Diagnosis not present

## 2023-07-04 DIAGNOSIS — H04123 Dry eye syndrome of bilateral lacrimal glands: Secondary | ICD-10-CM | POA: Diagnosis not present

## 2023-07-04 DIAGNOSIS — H52229 Regular astigmatism, unspecified eye: Secondary | ICD-10-CM | POA: Diagnosis not present

## 2023-07-04 DIAGNOSIS — H02834 Dermatochalasis of left upper eyelid: Secondary | ICD-10-CM | POA: Diagnosis not present

## 2023-07-04 DIAGNOSIS — H2513 Age-related nuclear cataract, bilateral: Secondary | ICD-10-CM | POA: Diagnosis not present

## 2023-08-16 DIAGNOSIS — H04123 Dry eye syndrome of bilateral lacrimal glands: Secondary | ICD-10-CM | POA: Diagnosis not present

## 2023-08-16 DIAGNOSIS — H2511 Age-related nuclear cataract, right eye: Secondary | ICD-10-CM | POA: Diagnosis not present

## 2023-09-14 DIAGNOSIS — H25811 Combined forms of age-related cataract, right eye: Secondary | ICD-10-CM | POA: Diagnosis not present

## 2023-09-26 DIAGNOSIS — E7800 Pure hypercholesterolemia, unspecified: Secondary | ICD-10-CM | POA: Diagnosis not present

## 2023-09-26 DIAGNOSIS — I2510 Atherosclerotic heart disease of native coronary artery without angina pectoris: Secondary | ICD-10-CM | POA: Diagnosis not present

## 2023-09-26 DIAGNOSIS — Z8042 Family history of malignant neoplasm of prostate: Secondary | ICD-10-CM | POA: Diagnosis not present

## 2023-09-28 DIAGNOSIS — N529 Male erectile dysfunction, unspecified: Secondary | ICD-10-CM | POA: Diagnosis not present

## 2023-09-28 DIAGNOSIS — E7800 Pure hypercholesterolemia, unspecified: Secondary | ICD-10-CM | POA: Diagnosis not present

## 2023-09-28 DIAGNOSIS — Z23 Encounter for immunization: Secondary | ICD-10-CM | POA: Diagnosis not present

## 2023-09-28 DIAGNOSIS — I2510 Atherosclerotic heart disease of native coronary artery without angina pectoris: Secondary | ICD-10-CM | POA: Diagnosis not present

## 2023-10-03 DIAGNOSIS — H2512 Age-related nuclear cataract, left eye: Secondary | ICD-10-CM | POA: Diagnosis not present

## 2023-10-05 DIAGNOSIS — H25812 Combined forms of age-related cataract, left eye: Secondary | ICD-10-CM | POA: Diagnosis not present

## 2023-10-27 ENCOUNTER — Emergency Department (HOSPITAL_COMMUNITY)

## 2023-10-27 ENCOUNTER — Encounter (HOSPITAL_COMMUNITY): Payer: Self-pay

## 2023-10-27 ENCOUNTER — Inpatient Hospital Stay (HOSPITAL_COMMUNITY)
Admission: EM | Admit: 2023-10-27 | Discharge: 2023-10-31 | DRG: 439 | Disposition: A | Discharge status: Home / Self Care | Attending: Family Medicine | Admitting: Family Medicine

## 2023-10-27 ENCOUNTER — Other Ambulatory Visit: Payer: Self-pay

## 2023-10-27 DIAGNOSIS — E872 Acidosis, unspecified: Secondary | ICD-10-CM | POA: Diagnosis present

## 2023-10-27 DIAGNOSIS — E860 Dehydration: Secondary | ICD-10-CM | POA: Diagnosis present

## 2023-10-27 DIAGNOSIS — R0789 Other chest pain: Secondary | ICD-10-CM | POA: Diagnosis not present

## 2023-10-27 DIAGNOSIS — K5730 Diverticulosis of large intestine without perforation or abscess without bleeding: Secondary | ICD-10-CM | POA: Diagnosis not present

## 2023-10-27 DIAGNOSIS — K859 Acute pancreatitis without necrosis or infection, unspecified: Secondary | ICD-10-CM | POA: Diagnosis present

## 2023-10-27 DIAGNOSIS — E785 Hyperlipidemia, unspecified: Secondary | ICD-10-CM | POA: Diagnosis present

## 2023-10-27 DIAGNOSIS — R197 Diarrhea, unspecified: Secondary | ICD-10-CM | POA: Diagnosis present

## 2023-10-27 DIAGNOSIS — Z8719 Personal history of other diseases of the digestive system: Secondary | ICD-10-CM

## 2023-10-27 DIAGNOSIS — Z860100 Personal history of colon polyps, unspecified: Secondary | ICD-10-CM

## 2023-10-27 DIAGNOSIS — R109 Unspecified abdominal pain: Secondary | ICD-10-CM | POA: Diagnosis not present

## 2023-10-27 DIAGNOSIS — Z6826 Body mass index (BMI) 26.0-26.9, adult: Secondary | ICD-10-CM | POA: Diagnosis not present

## 2023-10-27 DIAGNOSIS — Z79899 Other long term (current) drug therapy: Secondary | ICD-10-CM

## 2023-10-27 DIAGNOSIS — R634 Abnormal weight loss: Secondary | ICD-10-CM | POA: Diagnosis present

## 2023-10-27 DIAGNOSIS — R1013 Epigastric pain: Secondary | ICD-10-CM | POA: Diagnosis not present

## 2023-10-27 DIAGNOSIS — I700 Atherosclerosis of aorta: Secondary | ICD-10-CM | POA: Diagnosis not present

## 2023-10-27 DIAGNOSIS — I2510 Atherosclerotic heart disease of native coronary artery without angina pectoris: Secondary | ICD-10-CM | POA: Diagnosis present

## 2023-10-27 DIAGNOSIS — R079 Chest pain, unspecified: Secondary | ICD-10-CM | POA: Diagnosis not present

## 2023-10-27 DIAGNOSIS — D696 Thrombocytopenia, unspecified: Secondary | ICD-10-CM | POA: Diagnosis present

## 2023-10-27 DIAGNOSIS — K862 Cyst of pancreas: Secondary | ICD-10-CM | POA: Diagnosis present

## 2023-10-27 DIAGNOSIS — Z85828 Personal history of other malignant neoplasm of skin: Secondary | ICD-10-CM

## 2023-10-27 DIAGNOSIS — Z955 Presence of coronary angioplasty implant and graft: Secondary | ICD-10-CM | POA: Diagnosis not present

## 2023-10-27 DIAGNOSIS — K8689 Other specified diseases of pancreas: Secondary | ICD-10-CM | POA: Diagnosis present

## 2023-10-27 DIAGNOSIS — R9389 Abnormal findings on diagnostic imaging of other specified body structures: Secondary | ICD-10-CM | POA: Diagnosis not present

## 2023-10-27 DIAGNOSIS — N400 Enlarged prostate without lower urinary tract symptoms: Secondary | ICD-10-CM | POA: Diagnosis present

## 2023-10-27 LAB — COMPREHENSIVE METABOLIC PANEL WITH GFR
ALT: 16 U/L (ref 0–44)
AST: 20 U/L (ref 15–41)
Albumin: 4 g/dL (ref 3.5–5.0)
Alkaline Phosphatase: 77 U/L (ref 38–126)
Anion gap: 11 (ref 5–15)
BUN: 8 mg/dL (ref 8–23)
CO2: 23 mmol/L (ref 22–32)
Calcium: 9.4 mg/dL (ref 8.9–10.3)
Chloride: 104 mmol/L (ref 98–111)
Creatinine, Ser: 0.85 mg/dL (ref 0.61–1.24)
GFR, Estimated: >60 mL/min (ref >60)
Glucose, Bld: 108 mg/dL — ABNORMAL HIGH (ref 70–99)
Potassium: 4 mmol/L (ref 3.5–5.1)
Sodium: 138 mmol/L (ref 135–145)
Total Bilirubin: 1.2 mg/dL (ref 0.0–1.2)
Total Protein: 7 g/dL (ref 6.5–8.1)

## 2023-10-27 LAB — CBC
HCT: 43.1 % (ref 39.0–52.0)
Hemoglobin: 14.7 g/dL (ref 13.0–17.0)
MCH: 32 pg (ref 26.0–34.0)
MCHC: 34.1 g/dL (ref 30.0–36.0)
MCV: 93.9 fL (ref 80.0–100.0)
Platelets: 152 K/uL (ref 150–400)
RBC: 4.59 MIL/uL (ref 4.22–5.81)
RDW: 12.1 % (ref 11.5–15.5)
WBC: 7.1 K/uL (ref 4.0–10.5)
nRBC: 0 % (ref 0.0–0.2)

## 2023-10-27 LAB — URINALYSIS, ROUTINE W REFLEX MICROSCOPIC
Bilirubin Urine: NEGATIVE
Glucose, UA: NEGATIVE mg/dL
Hgb urine dipstick: NEGATIVE
Ketones, ur: 5 mg/dL — AB
Leukocytes,Ua: NEGATIVE
Nitrite: NEGATIVE
Protein, ur: NEGATIVE mg/dL
Specific Gravity, Urine: 1.017 (ref 1.005–1.030)
pH: 5 (ref 5.0–8.0)

## 2023-10-27 LAB — TROPONIN I (HIGH SENSITIVITY)
Troponin I (High Sensitivity): 4 ng/L (ref <18)
Troponin I (High Sensitivity): 4 ng/L (ref <18)

## 2023-10-27 LAB — LIPASE, BLOOD: Lipase: 323 U/L — ABNORMAL HIGH (ref 11–51)

## 2023-10-27 MED ORDER — OXYCODONE-ACETAMINOPHEN 5-325 MG PO TABS
1.0000 | ORAL_TABLET | Freq: Once | ORAL | Status: AC
  Administered 2023-10-27: 1 via ORAL
  Filled 2023-10-27: qty 1

## 2023-10-27 MED ORDER — IOHEXOL 350 MG/ML SOLN
75.0000 mL | Freq: Once | INTRAVENOUS | Status: AC | PRN
  Administered 2023-10-27: 75 mL via INTRAVENOUS

## 2023-10-27 NOTE — ED Provider Triage Note (Signed)
 Emergency Medicine Provider Triage Evaluation Note  Steven Harrison , a 78 y.o. male  was evaluated in triage.  Pt complains of epigastric pain, abdominal pain, diarrhea, radiation to the chest.  He does endorse some nausea.  Does have a history of previous stent.  He reports this feels dissimilar.  Advised by urgent care to come in for evaluation.  Endorses 8/10 pain.  No previous abdominal surgery.  Review of Systems  Positive: Abdominal pain, chest pain Negative:   Physical Exam  BP 122/79 (BP Location: Right Arm)   Pulse 61   Temp 97.8 F (36.6 C)   Resp 17   Ht 5' 8.5 (1.74 m)   Wt 80.3 kg   SpO2 98%   BMI 26.52 kg/m  Gen:   Awake, no distress   Resp:  Normal effort  MSK:   Moves extremities without difficulty  Other:  Significant tenderness to palpation of the epigastric region, no rebound or rigidity, guarding  Medical Decision Making  Medically screening exam initiated at 6:54 PM.  Appropriate orders placed.  Steven Harrison was informed that the remainder of the evaluation will be completed by another provider, this initial triage assessment does not replace that evaluation, and the importance of remaining in the ED until their evaluation is complete.  Workup initiated in triage    Steven Harrison, [place] 10/27/23 [id number]

## 2023-10-27 NOTE — ED Triage Notes (Signed)
 Pt c.o epigastric pain that radiates down into his abd with associated diarrhea since Wednesday. Some nausea

## 2023-10-27 NOTE — ED Triage Notes (Signed)
 From UC, c.o of abdominal and chest pain and diarrhea. UC advised to come here

## 2023-10-28 ENCOUNTER — Observation Stay (HOSPITAL_COMMUNITY)

## 2023-10-28 DIAGNOSIS — K8689 Other specified diseases of pancreas: Secondary | ICD-10-CM | POA: Diagnosis not present

## 2023-10-28 DIAGNOSIS — Z955 Presence of coronary angioplasty implant and graft: Secondary | ICD-10-CM | POA: Diagnosis not present

## 2023-10-28 DIAGNOSIS — Z85828 Personal history of other malignant neoplasm of skin: Secondary | ICD-10-CM | POA: Diagnosis not present

## 2023-10-28 DIAGNOSIS — I2510 Atherosclerotic heart disease of native coronary artery without angina pectoris: Secondary | ICD-10-CM | POA: Diagnosis present

## 2023-10-28 DIAGNOSIS — E872 Acidosis, unspecified: Secondary | ICD-10-CM | POA: Diagnosis present

## 2023-10-28 DIAGNOSIS — Z860100 Personal history of colon polyps, unspecified: Secondary | ICD-10-CM | POA: Diagnosis not present

## 2023-10-28 DIAGNOSIS — K862 Cyst of pancreas: Secondary | ICD-10-CM | POA: Diagnosis present

## 2023-10-28 DIAGNOSIS — E860 Dehydration: Secondary | ICD-10-CM | POA: Diagnosis present

## 2023-10-28 DIAGNOSIS — R197 Diarrhea, unspecified: Secondary | ICD-10-CM | POA: Diagnosis present

## 2023-10-28 DIAGNOSIS — E785 Hyperlipidemia, unspecified: Secondary | ICD-10-CM | POA: Diagnosis present

## 2023-10-28 DIAGNOSIS — R634 Abnormal weight loss: Secondary | ICD-10-CM | POA: Diagnosis present

## 2023-10-28 DIAGNOSIS — N400 Enlarged prostate without lower urinary tract symptoms: Secondary | ICD-10-CM | POA: Diagnosis present

## 2023-10-28 DIAGNOSIS — R109 Unspecified abdominal pain: Secondary | ICD-10-CM | POA: Diagnosis not present

## 2023-10-28 DIAGNOSIS — D696 Thrombocytopenia, unspecified: Secondary | ICD-10-CM | POA: Diagnosis present

## 2023-10-28 DIAGNOSIS — Z79899 Other long term (current) drug therapy: Secondary | ICD-10-CM | POA: Diagnosis not present

## 2023-10-28 DIAGNOSIS — K859 Acute pancreatitis without necrosis or infection, unspecified: Secondary | ICD-10-CM | POA: Diagnosis present

## 2023-10-28 DIAGNOSIS — Z8719 Personal history of other diseases of the digestive system: Secondary | ICD-10-CM | POA: Diagnosis not present

## 2023-10-28 DIAGNOSIS — Z6826 Body mass index (BMI) 26.0-26.9, adult: Secondary | ICD-10-CM | POA: Diagnosis not present

## 2023-10-28 LAB — CBC WITH DIFFERENTIAL/PLATELET
Abs Immature Granulocytes: 0.01 K/uL (ref 0.00–0.07)
Basophils Absolute: 0 K/uL (ref 0.0–0.1)
Basophils Relative: 0 %
Eosinophils Absolute: 0.1 K/uL (ref 0.0–0.5)
Eosinophils Relative: 1 %
HCT: 37 % — ABNORMAL LOW (ref 39.0–52.0)
Hemoglobin: 12.9 g/dL — ABNORMAL LOW (ref 13.0–17.0)
Immature Granulocytes: 0 %
Lymphocytes Relative: 12 %
Lymphs Abs: 0.7 K/uL (ref 0.7–4.0)
MCH: 32.9 pg (ref 26.0–34.0)
MCHC: 34.9 g/dL (ref 30.0–36.0)
MCV: 94.4 fL (ref 80.0–100.0)
Monocytes Absolute: 0.4 K/uL (ref 0.1–1.0)
Monocytes Relative: 8 %
Neutro Abs: 4.4 K/uL (ref 1.7–7.7)
Neutrophils Relative %: 79 %
Platelets: 107 K/uL — ABNORMAL LOW (ref 150–400)
RBC: 3.92 MIL/uL — ABNORMAL LOW (ref 4.22–5.81)
RDW: 12.3 % (ref 11.5–15.5)
WBC: 5.6 K/uL (ref 4.0–10.5)
nRBC: 0 % (ref 0.0–0.2)

## 2023-10-28 LAB — COMPREHENSIVE METABOLIC PANEL WITH GFR
ALT: 12 U/L (ref 0–44)
AST: 19 U/L (ref 15–41)
Albumin: 3 g/dL — ABNORMAL LOW (ref 3.5–5.0)
Alkaline Phosphatase: 58 U/L (ref 38–126)
Anion gap: 9 (ref 5–15)
BUN: 7 mg/dL — ABNORMAL LOW (ref 8–23)
CO2: 20 mmol/L — ABNORMAL LOW (ref 22–32)
Calcium: 7.3 mg/dL — ABNORMAL LOW (ref 8.9–10.3)
Chloride: 111 mmol/L (ref 98–111)
Creatinine, Ser: 0.71 mg/dL (ref 0.61–1.24)
GFR, Estimated: >60 mL/min (ref >60)
Glucose, Bld: 85 mg/dL (ref 70–99)
Potassium: 3.6 mmol/L (ref 3.5–5.1)
Sodium: 140 mmol/L (ref 135–145)
Total Bilirubin: 1.2 mg/dL (ref 0.0–1.2)
Total Protein: 5.3 g/dL — ABNORMAL LOW (ref 6.5–8.1)

## 2023-10-28 LAB — LIPID PANEL
Cholesterol: 56 mg/dL (ref 0–200)
HDL: 24 mg/dL — ABNORMAL LOW (ref >40)
LDL Cholesterol: 28 mg/dL (ref 0–99)
Total CHOL/HDL Ratio: 2.3 ratio
Triglycerides: 19 mg/dL (ref <150)
VLDL: 4 mg/dL (ref 0–40)

## 2023-10-28 LAB — LIPASE, BLOOD: Lipase: 80 U/L — ABNORMAL HIGH (ref 11–51)

## 2023-10-28 MED ORDER — FENTANYL CITRATE (PF) 50 MCG/ML IJ SOSY
50.0000 ug | PREFILLED_SYRINGE | Freq: Once | INTRAMUSCULAR | Status: AC
  Administered 2023-10-28: 50 ug via INTRAVENOUS
  Filled 2023-10-28: qty 1

## 2023-10-28 MED ORDER — SODIUM CHLORIDE 0.9 % IV SOLN: INTRAVENOUS | Status: DC

## 2023-10-28 MED ORDER — BOOST / RESOURCE BREEZE PO LIQD CUSTOM
1.0000 | Freq: Three times a day (TID) | ORAL | Status: DC
  Administered 2023-10-28 – 2023-10-31 (×7): 1 via ORAL

## 2023-10-28 MED ORDER — FENTANYL CITRATE (PF) 50 MCG/ML IJ SOSY: 25.0000 ug | PREFILLED_SYRINGE | INTRAMUSCULAR | Status: DC | PRN

## 2023-10-28 MED ORDER — GADOBUTROL 1 MMOL/ML IV SOLN
8.0000 mL | Freq: Once | INTRAVENOUS | Status: AC | PRN
Start: 2023-10-28 — End: 2023-10-28
  Administered 2023-10-28: 8 mL via INTRAVENOUS

## 2023-10-28 MED ORDER — MORPHINE SULFATE (PF) 2 MG/ML IV SOLN
1.0000 mg | INTRAVENOUS | Status: DC | PRN
  Administered 2023-10-28: 2 mg via INTRAVENOUS
  Filled 2023-10-28: qty 1

## 2023-10-28 MED ORDER — ENOXAPARIN SODIUM 40 MG/0.4ML IJ SOSY
40.0000 mg | PREFILLED_SYRINGE | INTRAMUSCULAR | Status: DC
  Administered 2023-10-28 – 2023-10-31 (×4): 40 mg via SUBCUTANEOUS
  Filled 2023-10-28 (×4): qty 0.4

## 2023-10-28 MED ORDER — TAMSULOSIN HCL 0.4 MG PO CAPS
0.4000 mg | ORAL_CAPSULE | Freq: Every day | ORAL | Status: DC
  Administered 2023-10-29 – 2023-10-30 (×2): 0.4 mg via ORAL
  Filled 2023-10-28 (×3): qty 1

## 2023-10-28 MED ORDER — OXYCODONE HCL 5 MG PO TABS
5.0000 mg | ORAL_TABLET | ORAL | Status: DC | PRN
  Administered 2023-10-28 – 2023-10-29 (×5): 5 mg via ORAL
  Filled 2023-10-28 (×5): qty 1

## 2023-10-28 MED ORDER — PROCHLORPERAZINE EDISYLATE 10 MG/2ML IJ SOLN: 5.0000 mg | INTRAMUSCULAR | Status: DC | PRN

## 2023-10-28 MED ORDER — SODIUM CHLORIDE 0.9 % IV SOLN: INTRAVENOUS | Status: AC

## 2023-10-28 MED ORDER — ONDANSETRON HCL 4 MG/2ML IJ SOLN
4.0000 mg | Freq: Once | INTRAMUSCULAR | Status: AC
  Administered 2023-10-28: 4 mg via INTRAVENOUS
  Filled 2023-10-28: qty 2

## 2023-10-28 MED ORDER — SODIUM CHLORIDE 0.9% FLUSH
3.0000 mL | Freq: Two times a day (BID) | INTRAVENOUS | Status: DC
  Administered 2023-10-28 – 2023-10-31 (×7): 3 mL via INTRAVENOUS

## 2023-10-28 MED ORDER — ACETAMINOPHEN 325 MG PO TABS
650.0000 mg | ORAL_TABLET | Freq: Four times a day (QID) | ORAL | Status: DC | PRN
  Administered 2023-10-28: 650 mg via ORAL
  Filled 2023-10-28: qty 2

## 2023-10-28 NOTE — H&P (Signed)
 History and Physical    Patient: Steven Harrison FMW:[id number] DOB: 1945-05-22 DOA: 10/27/2023 DOS: the patient was seen and examined on 10/28/2023 PCP: Leonel Cole, MD  Patient coming from: Home  Chief Complaint:  Chief Complaint  Patient presents with   Abdominal Pain   Chest Pain   HPI: TABIUS ROOD is a 78 y.o. male with medical history significant of CAD status post stent on Lipitor, BPH, dyslipidemia who presented initially to urgent care with complaint of abdominal chest pain associated with diarrhea.  Was advised by urgent care to present to [facility] ED.  He reports that he has been having headache, left upper quadrant abdominal pain that radiates down to the mid abdomen to the right and through the back as well as associated diarrhea intermittently since last Wednesday but has become increasingly worse.  Has been having high-volume watery diarrhea.  Pain exacerbated and brought on by eating food.  He has had similar very mild episodes on and off for the past few years.  In the ED his LFTs were normal.  His lipase was elevated at 323.  CT abdomen and pelvis revealed mild peripancreatic stranding concerning for pancreatitis.  There was also dilatation of the pancreatic duct with multiple small cystic pancreatic lesions with further evaluation on a nonemergent basis of the pancreas with MRI.  Patient continues to have significant abdominal pain level 7 out of 10 that has improved somewhat with narcotic pain medications.  Hospitalist service has been asked to evaluate the patient for admission.  In review of past medical history patient has been taking Lipitor low-dose since 1999 and has not had any issues with this medication.  His triglycerides are normal at 55.  He does not drink alcohol.  He has not had any viral symptoms in the past few weeks.  CT scan reveals no evidence of gallstones.  Since arrival we have obtained an MRCP which confirmed acute pancreatitis involving the pancreatic  head.  The main pancreatic duct was dilated up to 7 mm in the body and tail of the pancreas and gradually tapers to the head of the pancreas into the ampulla.  No discrete pancreatic mass lesion evident by MRI but the assessment was reported as limited.  Recommendation was for endoscopic ultrasound evaluation to definitively exclude underlying pancreatic head mass.  There were numerous pancreatic parenchymal cystic foci throughout the body and tail of the pancreas with dominant cystic lesions in the tip of the pancreatic tail measuring up to 2.1 cm underlying intraductal papillary mucinous neoplasm could not be ruled out.  Dr. Saintclair with [facility] has been consulted.   Review of Systems: As mentioned in the history of present illness. All other systems reviewed and are negative.   Past Medical History:  Diagnosis Date   Cancer (HCC)    SKIN CANCER EAR AND FOREHEAD , FROZEN OFF   Coronary artery disease    coronary stent - 1999 - released by carsdiologist several years ago per patient , 06-24-2018 DENIES ACUTE CARDIAC SX    Hemorrhoids    Past Surgical History:  Procedure Laterality Date   COLONOSCOPY N/A 12/13/2017   Procedure: COLONOSCOPY;  Surgeon: Teresa Lonni HERO, MD;  Location: [facility];  Service: General;  Laterality: N/A;   COLONOSCOPY WITH PROPOFOL  N/A 02/08/2016   Procedure: COLONOSCOPY WITH PROPOFOL ;  Surgeon: Gladis MARLA Louder, MD;  Location: [facility];  Service: Endoscopy;  Laterality: N/A;   HEMORRHOID SURGERY N/A 06/26/2018   Procedure: HEMORRHOIDECTOMY;  Surgeon: Teresa,  Lonni HERO, MD;  Location: [facility];  Service: General;  Laterality: N/A;   NO PAST SURGERIES     Patient had a stent palced in 1999   POLYPECTOMY  12/13/2017   Procedure: POLYPECTOMY;  Surgeon: Teresa Lonni HERO, MD;  Location: [facility];  Service: General;;   RECTAL EXAM UNDER ANESTHESIA N/A 06/26/2018   Procedure: ANORECTAL EXAM UNDER ANESTHESIA;  Surgeon: Teresa Lonni HERO, MD;  Location: [facility] ;  Service: General;  Laterality: N/A;   Social History:  reports that he has never smoked. He has never used smokeless tobacco. He reports that he does not drink alcohol and does not use drugs.  No Known Allergies  History reviewed. No pertinent family history.  Prior to Admission medications   Medication Sig Start Date End Date Taking? Authorizing Provider  atorvastatin  (LIPITOR) 20 MG tablet Take 20 mg by mouth daily.     [provider]  atorvastatin  (LIPITOR) 20 MG tablet TAKE ONE (1) TABLET BY MOUTH EVERY DAY 11/17/19   Merilee, L.Addie, MD  cyanocobalamin 1000 MCG tablet Take 1,000 mcg by mouth daily.    [provider]  ibuprofen  (ADVIL ) 600 MG tablet Take 1 tablet (600 mg total) by mouth every 8 (eight) hours as needed for moderate pain. 10/18/20   LampteyAleene KIDD, MD  Menthol-Zinc Oxide [facility] EX) Apply 1 application topically daily as needed (irritation).    [provider]  Multiple Vitamin (MULTIVITAMIN WITH MINERALS) TABS tablet Take 1 tablet by mouth daily.    [provider]  Multiple Vitamins-Minerals (PRESERVISION AREDS 2 PO) Take by mouth daily.    [provider]  polyethylene glycol (MIRALAX / GLYCOLAX) 17 g packet Take 17 g by mouth daily as needed.    [provider]  sildenafil  (VIAGRA ) 100 MG tablet Take 100 mg by mouth daily as needed for erectile dysfunction. 4 per month    [provider]  sildenafil  (VIAGRA ) 100 MG tablet TAKE ONE (1) TABLET BY MOUTH ONE TIME AS DIRECTED 09/04/19 09/03/20  Merilee, L.Addie, MD  sildenafil  (VIAGRA ) 100 MG tablet TAKE 1 TABLET AS NEEDED AS DIRECTED FOR SEXUAL FUNCTION. 270 04/28/20     sildenafil  (VIAGRA ) 100 MG tablet TAKE 1 TABLET AS NEEDED AS DIRECTED FOR SEXUAL FUNCTION. 06/28/20     sodium chloride  (OCEAN) 0.65 % SOLN nasal spray Place 1 spray into both nostrils as needed (dryness).    [provider]  Wheat  Dextrin (BENEFIBER) POWD Take by mouth as needed.    [provider]    Physical Exam: Vitals:   10/28/23 0700 10/28/23 0816 10/28/23 0928 10/28/23 0935  BP: (!) 141/67 136/67  (!) 141/74  Pulse: 74 63  64  Resp: 12 16    Temp:  98.2 F (36.8 C)  (!) 97.5 F (36.4 C)  TempSrc:  Oral  Oral  SpO2: 99% 99%  100%  Weight:   81.6 kg   Height:   5' 8 (1.727 m)    Constitutional: NAD, calm, uncomfortable secondary to ongoing abdominal pain Respiratory: clear to auscultation bilaterally, no wheezing, no crackles. Normal respiratory effort. No accessory muscle use.  Cardiovascular: Regular rate and rhythm, no murmurs / rubs / gallops. No extremity edema. 2+ pedal pulses.  Abdomen: Focal tenderness left upper quadrant and mid abdominal regions without guarding or rebounding, no masses palpated. No hepatosplenomegaly. Bowel sounds positive.  Musculoskeletal: no clubbing / cyanosis. No joint deformity upper and lower extremities. Good ROM, no  contractures. Normal muscle tone.  Skin: no rashes, lesions, ulcers. No induration Neurologic: CN 2-12 grossly intact. Sensation intact,  Strength 5/5 x all 4 extremities.  Psychiatric: Normal judgment and insight. Alert and oriented x 3. Normal mood.     Data Reviewed:  Sodium 140, potassium 3.6, CO2 20, BUN 7, creatinine 0.71, calcium  7.3, albumin 3.0, total protein 5.3, lipase down to 80  Cholesterol 56, HDL cholesterol 24, LDL cholesterol 28, triglycerides 19, VLDL cholesterol 4  WBC 5600 with normal differential, hemoglobin 12.9, platelets 107,000  Assessment and Plan: Acute pancreatitis Pancreatic ductal dilatation, rule out IPMN Patient has had intermittent issues with postprandial nausea, abdominal pain and diarrhea which has become worse since this past Wednesday to the point he sought medical attention Typical causes for pancreatitis have been ruled out: Biliary, alcoholic, drug-induced, hypertriglyceridemia MRCP concerning for  potential pancreatic ductal mass or IPMN-GI consulted for EUS Continue both oral and IV narcotics for pain management Compazine for nausea  Dehydration/metabolic acidosis with normal anion gap Poor oral intake and diarrhea prior to arrival contributory Continue IV fluids-once cleared with GI can add clear liquids  Acute thrombocytopenia Likely reactive and possibly consumptive related to acute illness Have started Lovenox but if platelets continue to drop we will need to discontinue the Lovenox Follow labs  CAD History of stent previously on aspirin and currently maintained on Lipitor per patient Currently asymptomatic without any chest pain or shortness of breath Resume Lipitor when diet advanced  BPH Continue Flomax  Dyslipidemia Lipitor as above Lipid panel this admission demonstrates suboptimal HDL cholesterol of 24  Normocytic anemia Hemoglobin 12.9 No active bleeding If hemoglobin decreases further we will need to obtain anemia panel and follow fecal occult blood    Advance Care Planning:   Code Status: Full Code   VTE prophylaxis: Lovenox  Consults: Gastroenterology  Family Communication: Wife at bedside  Severity of Illness: The appropriate patient status for this patient is OBSERVATION. Observation status is judged to be reasonable and necessary in order to provide the required intensity of service to ensure the patient's safety. The patient's presenting symptoms, physical exam findings, and initial radiographic and laboratory data in the context of their medical condition is felt to place them at decreased risk for further clinical deterioration. Furthermore, it is anticipated that the patient will be medically stable for discharge from the hospital within 2 midnights of admission.   Author: Isaiah Lever, NP 10/28/2023 11:08 AM  For on call review [email or web address].

## 2023-10-28 NOTE — Plan of Care (Signed)
  Problem: Education: Goal: Knowledge of General Education information will improve Description: Including pain rating scale, medication(s)/side effects and non-pharmacologic comfort measures 10/28/2023 1834 by Casimir Keturah LABOR, RN Outcome: Progressing 10/28/2023 0949 by Casimir Keturah LABOR, RN Outcome: Progressing   Problem: Clinical Measurements: Goal: Will remain free from infection 10/28/2023 1834 by Casimir Keturah LABOR, RN Outcome: Progressing 10/28/2023 0949 by Casimir Keturah LABOR, RN Outcome: Progressing   Problem: Activity: Goal: Risk for activity intolerance will decrease 10/28/2023 1834 by Casimir Keturah LABOR, RN Outcome: Progressing 10/28/2023 0949 by Casimir Keturah LABOR, RN Outcome: Progressing   Problem: Nutrition: Goal: Adequate nutrition will be maintained 10/28/2023 1834 by Casimir Keturah LABOR, RN Outcome: Progressing 10/28/2023 0949 by Casimir Keturah LABOR, RN Outcome: Progressing   Problem: Coping: Goal: Level of anxiety will decrease 10/28/2023 1834 by Casimir Keturah LABOR, RN Outcome: Progressing 10/28/2023 0949 by Casimir Keturah LABOR, RN Outcome: Progressing

## 2023-10-28 NOTE — ED Provider Notes (Signed)
 [place] EMERGENCY DEPARTMENT AT [address] [facility] Provider Note   CSN: [id number] Arrival date & time: 10/27/23  1741     Patient presents with: Abdominal Pain and Chest Pain   Steven Harrison is a 78 y.o. male.   The history is provided by the patient.  Abdominal Pain Pain location:  Periumbilical Pain radiates to:  Epigastric region and chest Pain severity:  Severe Onset quality:  Gradual Duration:  4 days Progression:  Unchanged Chronicity:  New Context: not alcohol use, not diet changes and not eating   Relieved by:  Nothing Worsened by:  Nothing Ineffective treatments:  None tried Associated symptoms: nausea   Associated symptoms: no fever and no vomiting   Risk factors: no alcohol abuse   Patient with CAD with stents presents with periumbilical pain with radiation to the epigastrum and chest.  Some nausea.  No alcohol.  No dietary changes    Past Medical History:  Diagnosis Date   Cancer (HCC)    SKIN CANCER EAR AND FOREHEAD , FROZEN OFF   Coronary artery disease    coronary stent - 1999 - released by carsdiologist several years ago per patient , 06-24-2018 DENIES ACUTE CARDIAC SX    Hemorrhoids      Prior to Admission medications   Medication Sig Start Date End Date Taking? Authorizing Provider  atorvastatin  (LIPITOR) 20 MG tablet Take 20 mg by mouth daily.     [provider]  atorvastatin  (LIPITOR) 20 MG tablet TAKE ONE (1) TABLET BY MOUTH EVERY DAY 11/17/19   Merilee, L.Addie, MD  cyanocobalamin 1000 MCG tablet Take 1,000 mcg by mouth daily.    [provider]  ibuprofen  (ADVIL ) 600 MG tablet Take 1 tablet (600 mg total) by mouth every 8 (eight) hours as needed for moderate pain. 10/18/20   LampteyAleene KIDD, MD  Menthol-Zinc Oxide [facility] EX) Apply 1 application topically daily as needed (irritation).    [provider]  Multiple Vitamin (MULTIVITAMIN WITH MINERALS) TABS tablet Take 1 tablet by mouth daily.     [provider]  Multiple Vitamins-Minerals (PRESERVISION AREDS 2 PO) Take by mouth daily.    [provider]  polyethylene glycol (MIRALAX / GLYCOLAX) 17 g packet Take 17 g by mouth daily as needed.    [provider]  sildenafil  (VIAGRA ) 100 MG tablet Take 100 mg by mouth daily as needed for erectile dysfunction. 4 per month    [provider]  sildenafil  (VIAGRA ) 100 MG tablet TAKE ONE (1) TABLET BY MOUTH ONE TIME AS DIRECTED 09/04/19 09/03/20  Merilee, L.Addie, MD  sildenafil  (VIAGRA ) 100 MG tablet TAKE 1 TABLET AS NEEDED AS DIRECTED FOR SEXUAL FUNCTION. 270 04/28/20     sildenafil  (VIAGRA ) 100 MG tablet TAKE 1 TABLET AS NEEDED AS DIRECTED FOR SEXUAL FUNCTION. 06/28/20     sodium chloride  (OCEAN) 0.65 % SOLN nasal spray Place 1 spray into both nostrils as needed (dryness).    [provider]  Wheat Dextrin (BENEFIBER) POWD Take by mouth as needed.    [provider]    Allergies: Patient has no known allergies.    Review of Systems  Constitutional:  Negative for fever.  Gastrointestinal:  Positive for abdominal pain and nausea. Negative for vomiting.  All other systems reviewed and are negative.   Updated Vital Signs BP 137/68   Pulse 67   Temp 98.5 F (36.9 C)   Resp (!) 25   Ht 5' 8.5 (1.74 m)  Wt 80.3 kg   SpO2 100%   BMI 26.52 kg/m   Physical Exam Vitals and nursing note reviewed.  Constitutional:      General: He is not in acute distress.    Appearance: He is well-developed. He is not diaphoretic.  HENT:     Head: Normocephalic and atraumatic.     Nose: Nose normal.  Eyes:     Conjunctiva/sclera: Conjunctivae normal.     Pupils: Pupils are equal, round, and reactive to light.  Cardiovascular:     Rate and Rhythm: Normal rate and regular rhythm.  Pulmonary:     Effort: Pulmonary effort is normal.     Breath sounds: Normal breath sounds. No wheezing or rales.  Abdominal:     General: Bowel sounds are normal.      Palpations: Abdomen is soft.     Tenderness: There is abdominal tenderness. There is no guarding or rebound.  Musculoskeletal:        General: Normal range of motion.     Cervical back: Normal range of motion and neck supple.  Skin:    General: Skin is warm and dry.     Capillary Refill: Capillary refill takes less than 2 seconds.  Neurological:     General: No focal deficit present.     Mental Status: He is alert and oriented to person, place, and time.     Deep Tendon Reflexes: Reflexes normal.  Psychiatric:        Mood and Affect: Mood normal.     (all labs ordered are listed, but only abnormal results are displayed) Results for orders placed or performed during the hospital encounter of 10/27/23  Urinalysis, Routine w reflex microscopic -Urine, Clean Catch   Collection Time: 10/27/23  6:13 PM  Result Value Ref Range   Color, Urine YELLOW YELLOW   APPearance CLEAR CLEAR   Specific Gravity, Urine 1.017 1.005 - 1.030   pH 5.0 5.0 - 8.0   Glucose, UA NEGATIVE NEGATIVE mg/dL   Hgb urine dipstick NEGATIVE NEGATIVE   Bilirubin Urine NEGATIVE NEGATIVE   Ketones, ur 5 (A) NEGATIVE mg/dL   Protein, ur NEGATIVE NEGATIVE mg/dL   Nitrite NEGATIVE NEGATIVE   Leukocytes,Ua NEGATIVE NEGATIVE  CBC   Collection Time: 10/27/23  6:30 PM  Result Value Ref Range   WBC 7.1 4.0 - 10.5 K/uL   RBC 4.59 4.22 - 5.81 MIL/uL   Hemoglobin 14.7 13.0 - 17.0 g/dL   HCT 56.8 60.9 - 47.9 %   MCV 93.9 80.0 - 100.0 fL   MCH 32.0 26.0 - 34.0 pg   MCHC 34.1 30.0 - 36.0 g/dL   RDW 87.8 88.4 - 84.4 %   Platelets 152 150 - 400 K/uL   nRBC 0.0 0.0 - 0.2 %  Comprehensive metabolic panel   Collection Time: 10/27/23  6:30 PM  Result Value Ref Range   Sodium 138 135 - 145 mmol/L   Potassium 4.0 3.5 - 5.1 mmol/L   Chloride 104 98 - 111 mmol/L   CO2 23 22 - 32 mmol/L   Glucose, Bld 108 (H) 70 - 99 mg/dL   BUN 8 8 - 23 mg/dL   Creatinine, Ser 9.14 0.61 - 1.24 mg/dL   Calcium  9.4 8.9 - 10.3 mg/dL   Total  Protein 7.0 6.5 - 8.1 g/dL   Albumin 4.0 3.5 - 5.0 g/dL   AST 20 15 - 41 U/L   ALT 16 0 - 44 U/L   Alkaline Phosphatase 77 38 -  126 U/L   Total Bilirubin 1.2 0.0 - 1.2 mg/dL   GFR, Estimated >39 >39 mL/min   Anion gap 11 5 - 15  Lipase, blood   Collection Time: 10/27/23  6:30 PM  Result Value Ref Range   Lipase 323 (H) 11 - 51 U/L  Troponin I (High Sensitivity)   Collection Time: 10/27/23  6:30 PM  Result Value Ref Range   Troponin I (High Sensitivity) 4 <18 ng/L  Troponin I (High Sensitivity)   Collection Time: 10/27/23  8:52 PM  Result Value Ref Range   Troponin I (High Sensitivity) 4 <18 ng/L   CT ABDOMEN PELVIS W CONTRAST Result Date: 10/27/2023 CLINICAL DATA:  Abdominal pain. EXAM: CT ABDOMEN AND PELVIS WITH CONTRAST TECHNIQUE: Multidetector CT imaging of the abdomen and pelvis was performed using the standard protocol following bolus administration of intravenous contrast. RADIATION DOSE REDUCTION: This exam was performed according to the departmental dose-optimization program which includes automated exposure control, adjustment of the mA and/or kV according to patient size and/or use of iterative reconstruction technique. CONTRAST:  75mL OMNIPAQUE IOHEXOL 350 MG/ML SOLN COMPARISON:  CT abdomen pelvis dated 12/11/2005. FINDINGS: Lower chest: Scattered small calcified granuloma. No intra-abdominal free air.  Small free fluid in the pelvis. Hepatobiliary: The liver is unremarkable. No biliary ductal dilatation. The gallbladder is unremarkable Pancreas: There is dilatation of the main pancreatic duct measuring up to 5 mm in diameter. Multiple small cystic lesions primarily in the tail of the pancreas are not characterized on this CT, possibly side branch IPMNs. Further evaluation of the pancreas with MRI without and with contrast on a nonemergent/outpatient basis recommended. Mild peripancreatic stranding. Correlation with pancreatic enzymes recommended to exclude acute pancreatitis.  Spleen: Normal in size without focal abnormality. Adrenals/Urinary Tract: The adrenal glands unremarkable. The kidneys, visualized ureters, and urinary bladder appear unremarkable. Stomach/Bowel: There is mild sigmoid diverticulosis. There is no bowel obstruction or active inflammation. The appendix is normal. Vascular/Lymphatic: Mild aortoiliac atherosclerotic disease. The IVC is unremarkable. No free gas. There is no adenopathy. Reproductive: The prostate and seminal vesicles are grossly remarkable. Other: None Musculoskeletal: Osteopenia with degenerative changes of the spine. No acute osseous pathology. IMPRESSION: 1. Mild peripancreatic stranding. Correlation with pancreatic enzymes recommended to exclude acute pancreatitis. 2. Dilatation of the pancreatic duct with multiple small cystic pancreatic lesions. Further evaluation of the pancreas with MRI without and with contrast on a nonemergent/outpatient basis recommended. 3. Mild sigmoid diverticulosis. No bowel obstruction. Normal appendix. 4.  Aortic Atherosclerosis (ICD10-I70.0). Electronically Signed   By: Vanetta Chou M.D.   On: 10/27/2023 20:18   DG Chest 2 View Result Date: 10/27/2023 CLINICAL DATA:  Chest pain. EXAM: CHEST - 2 VIEW COMPARISON:  05/10/2023. FINDINGS: The heart size and mediastinal contours are within normal limits. There is atherosclerotic calcification of the aorta. Chronic elevation left diaphragm is noted. No consolidation, effusion, or pneumothorax is seen. Degenerative changes are noted in the thoracolumbar spine. No acute osseous abnormality. IMPRESSION: No active cardiopulmonary disease. Electronically Signed   By: Leita Birmingham M.D.   On: 10/27/2023 19:20     EKG:  EKG Interpretation Date/Time:  Saturday October 27 2023 17:54:38 EDT Ventricular Rate:  67 PR Interval:  168 QRS Duration:  88 QT Interval:  400 QTC Calculation: 422 R Axis:   -15  Text Interpretation: Normal sinus rhythm Confirmed by Nettie,  Kacyn Souder ([id number]) on 10/28/2023 4:49:04 AM         Radiology: CT ABDOMEN PELVIS W CONTRAST Result Date: 10/27/2023  CLINICAL DATA:  Abdominal pain. EXAM: CT ABDOMEN AND PELVIS WITH CONTRAST TECHNIQUE: Multidetector CT imaging of the abdomen and pelvis was performed using the standard protocol following bolus administration of intravenous contrast. RADIATION DOSE REDUCTION: This exam was performed according to the departmental dose-optimization program which includes automated exposure control, adjustment of the mA and/or kV according to patient size and/or use of iterative reconstruction technique. CONTRAST:  75mL OMNIPAQUE IOHEXOL 350 MG/ML SOLN COMPARISON:  CT abdomen pelvis dated 12/11/2005. FINDINGS: Lower chest: Scattered small calcified granuloma. No intra-abdominal free air.  Small free fluid in the pelvis. Hepatobiliary: The liver is unremarkable. No biliary ductal dilatation. The gallbladder is unremarkable Pancreas: There is dilatation of the main pancreatic duct measuring up to 5 mm in diameter. Multiple small cystic lesions primarily in the tail of the pancreas are not characterized on this CT, possibly side branch IPMNs. Further evaluation of the pancreas with MRI without and with contrast on a nonemergent/outpatient basis recommended. Mild peripancreatic stranding. Correlation with pancreatic enzymes recommended to exclude acute pancreatitis. Spleen: Normal in size without focal abnormality. Adrenals/Urinary Tract: The adrenal glands unremarkable. The kidneys, visualized ureters, and urinary bladder appear unremarkable. Stomach/Bowel: There is mild sigmoid diverticulosis. There is no bowel obstruction or active inflammation. The appendix is normal. Vascular/Lymphatic: Mild aortoiliac atherosclerotic disease. The IVC is unremarkable. No free gas. There is no adenopathy. Reproductive: The prostate and seminal vesicles are grossly remarkable. Other: None Musculoskeletal: Osteopenia with degenerative  changes of the spine. No acute osseous pathology. IMPRESSION: 1. Mild peripancreatic stranding. Correlation with pancreatic enzymes recommended to exclude acute pancreatitis. 2. Dilatation of the pancreatic duct with multiple small cystic pancreatic lesions. Further evaluation of the pancreas with MRI without and with contrast on a nonemergent/outpatient basis recommended. 3. Mild sigmoid diverticulosis. No bowel obstruction. Normal appendix. 4.  Aortic Atherosclerosis (ICD10-I70.0). Electronically Signed   By: Vanetta Chou M.D.   On: 10/27/2023 20:18   DG Chest 2 View Result Date: 10/27/2023 CLINICAL DATA:  Chest pain. EXAM: CHEST - 2 VIEW COMPARISON:  05/10/2023. FINDINGS: The heart size and mediastinal contours are within normal limits. There is atherosclerotic calcification of the aorta. Chronic elevation left diaphragm is noted. No consolidation, effusion, or pneumothorax is seen. Degenerative changes are noted in the thoracolumbar spine. No acute osseous abnormality. IMPRESSION: No active cardiopulmonary disease. Electronically Signed   By: Leita Birmingham M.D.   On: 10/27/2023 19:20     Procedures   Medications Ordered in the ED  fentaNYL  (SUBLIMAZE ) injection 50 mcg (has no administration in time range)  0.9 %  sodium chloride  infusion (has no administration in time range)  ondansetron  (ZOFRAN ) injection 4 mg (has no administration in time range)  oxyCODONE -acetaminophen  (PERCOCET/ROXICET) 5-325 MG per tablet 1 tablet (1 tablet Oral Given 10/27/23 1901)  iohexol (OMNIPAQUE) 350 MG/ML injection 75 mL (75 mLs Intravenous Contrast Given 10/27/23 2006)                                    Medical Decision Making Patient with 4 days of abdominal pain and nausea. No ETOH  Amount and/or Complexity of Data Reviewed Independent Historian: spouse    Details: See above  External Data Reviewed: notes.    Details: Previous notes reviewed  Labs: ordered.    Details: Normal sodium 138, normal  potassium 4, normal creatinine 0.8, urine is negative markedly elevated lipase 323. Normal white count 7.1, normal hemoglobin 14.7, normal  platelets  Radiology: ordered and independent interpretation performed.    Details: No SBo by me   Risk Prescription drug management. Decision regarding hospitalization.     Final diagnoses:  Acute pancreatitis, unspecified complication status, unspecified pancreatitis type   The patient appears reasonably stabilized for admission considering the current resources, flow, and capabilities available in the ED at this time, and I doubt any other [facility] requiring further screening and/or treatment in the ED prior to admission.  ED Discharge Orders     None          Teigen Bellin, MD 10/28/23 [id number]

## 2023-10-28 NOTE — Plan of Care (Signed)

## 2023-10-28 NOTE — Consult Note (Signed)
 [facility] Gastroenterology Consult  Referring Provider: No ref. provider found Primary Care Physician:  Leonel Cole, MD Primary Gastroenterologist: Margarete primary/Dr. Gladis Louder  Reason for Consultation: Suspected pancreatitis  HPI: Steven Harrison is a 78 y.o. male who was in his usual state of health until last week when he developed epigastric and lower abdominal pain associated with nausea and sensation of feeling sick for which he went to urgent care and was advised to come to [facility] ED. Patient states the pain was severe, 10 out of 10 in intensity, radiated to his upper back and has never experienced this kind of pain before. He has lost his appetite, reports 6 pound weight loss in the last few days. He also reports that he has been having 2-3 episodes of loose watery stools, last had 2 diarrhea-like stools yesterday, none today. Denies change in the color of skin, urine or stool. Denies noticing blood in stool or black stools. Denies acid reflux, heartburn, difficulty swallowing or pain on swallowing. Denies use of alcohol. Denies use of new medications. Denies prior issues with pancreas. Denies family history of pancreatitis or pancreatic cancer.   Previous GI workup: Colonoscopy, inpatient, Dr. Lonni Pizza from surgery, rectal bleeding: Hemorrhoids, 3 mm splenic flexure polyp-tubular adenoma, diverticulosis in sigmoid Hemorrhoidectomy 06/2018 Colonoscopy, Dr. Louder, 2018, screening: Normal Colonoscopy, Dr. Louder, 2007, screening: Incomplete, advance up to hepatic flexure Virtual colonoscopy, 2007: Unremarkable  Past Medical History:  Diagnosis Date   Cancer [facility])    SKIN CANCER EAR AND FOREHEAD , FROZEN OFF   Coronary artery disease    coronary stent - 1999 - released by carsdiologist several years ago per patient , 06-24-2018 DENIES ACUTE CARDIAC SX    Hemorrhoids     Past Surgical History:  Procedure Laterality Date   COLONOSCOPY N/A 12/13/2017   Procedure:  COLONOSCOPY;  Surgeon: Pizza Lonni HERO, MD;  Location: [facility];  Service: General;  Laterality: N/A;   COLONOSCOPY WITH PROPOFOL  N/A 02/08/2016   Procedure: COLONOSCOPY WITH PROPOFOL ;  Surgeon: Gladis MARLA Louder, MD;  Location: [facility];  Service: Endoscopy;  Laterality: N/A;   HEMORRHOID SURGERY N/A 06/26/2018   Procedure: HEMORRHOIDECTOMY;  Surgeon: Pizza Lonni HERO, MD;  Location: [facility];  Service: General;  Laterality: N/A;   NO PAST SURGERIES     Patient had a stent palced in 1999   POLYPECTOMY  12/13/2017   Procedure: POLYPECTOMY;  Surgeon: Pizza Lonni HERO, MD;  Location: [facility];  Service: General;;   RECTAL EXAM UNDER ANESTHESIA N/A 06/26/2018   Procedure: ANORECTAL EXAM UNDER ANESTHESIA;  Surgeon: Pizza Lonni HERO, MD;  Location: [facility];  Service: General;  Laterality: N/A;    Prior to Admission medications   Medication Sig Start Date End Date Taking? Authorizing Provider  atorvastatin  (LIPITOR) 20 MG tablet Take 20 mg by mouth daily.     [provider]  atorvastatin  (LIPITOR) 20 MG tablet TAKE ONE (1) TABLET BY MOUTH EVERY DAY 11/17/19   Merilee, L.Addie, MD  cyanocobalamin 1000 MCG tablet Take 1,000 mcg by mouth daily.    [provider]  ibuprofen  (ADVIL ) 600 MG tablet Take 1 tablet (600 mg total) by mouth every 8 (eight) hours as needed for moderate pain. 10/18/20   LampteyAleene KIDD, MD  Menthol-Zinc Oxide [employer] EX) Apply 1 application topically daily as needed (irritation).    [provider]  Multiple Vitamin (MULTIVITAMIN WITH MINERALS) TABS tablet Take 1 tablet by mouth daily.    [provider]  Multiple Vitamins-Minerals (PRESERVISION AREDS 2 PO) Take by mouth daily.    [provider]  polyethylene glycol (MIRALAX / GLYCOLAX) 17 g packet Take 17 g by mouth daily as needed.    [provider]  sildenafil  (VIAGRA ) 100 MG tablet Take 100 mg by mouth  daily as needed for erectile dysfunction. 4 per month    [provider]  sildenafil  (VIAGRA ) 100 MG tablet TAKE ONE (1) TABLET BY MOUTH ONE TIME AS DIRECTED 09/04/19 09/03/20  Merilee, L.Addie, MD  sildenafil  (VIAGRA ) 100 MG tablet TAKE 1 TABLET AS NEEDED AS DIRECTED FOR SEXUAL FUNCTION. 270 04/28/20     sildenafil  (VIAGRA ) 100 MG tablet TAKE 1 TABLET AS NEEDED AS DIRECTED FOR SEXUAL FUNCTION. 06/28/20     sodium chloride  (OCEAN) 0.65 % SOLN nasal spray Place 1 spray into both nostrils as needed (dryness).    [provider]  Wheat Dextrin (BENEFIBER) POWD Take by mouth as needed.    [provider]    Current Facility-Administered Medications  Medication Dose Route Frequency Provider Last Rate Last Admin   0.9 %  sodium chloride  infusion   Intravenous Continuous Opyd, Timothy S, MD 125 mL/hr at 10/28/23 0547 New Bag at 10/28/23 0547   acetaminophen  (TYLENOL ) tablet 650 mg  650 mg Oral Q6H PRN Opyd, Timothy S, MD       fentaNYL  (SUBLIMAZE ) injection 25-50 mcg  25-50 mcg Intravenous Q2H PRN Opyd, Evalene RAMAN, MD       oxyCODONE  (Oxy IR/ROXICODONE ) immediate release tablet 5 mg  5 mg Oral Q4H PRN Opyd, Timothy S, MD   5 mg at 10/28/23 0956   prochlorperazine (COMPAZINE) injection 5 mg  5 mg Intravenous Q4H PRN Opyd, Timothy S, MD       tamsulosin (FLOMAX) capsule 0.4 mg  0.4 mg Oral QPC supper [place] Isaiah CROME, NP        Allergies as of 10/27/2023   (No Known Allergies)    History reviewed. No pertinent family history.  Social History   Socioeconomic History   Marital status: Married    Spouse name: Not on file   Number of children: Not on file   Years of education: Not on file   Highest education level: Not on file  Occupational History   Not on file  Tobacco Use   Smoking status: Never   Smokeless tobacco: Never  Vaping Use   Vaping status: Never Used  Substance and Sexual Activity   Alcohol use: No   Drug use: No   Sexual activity: Not on file  Other  Topics Concern   Not on file  Social History Narrative   Not on file   Social Drivers of Health   Financial Resource Strain: Not on file  Food Insecurity: No Food Insecurity (10/28/2023)   Hunger Vital Sign    Worried About Running Out of Food in the Last Year: Never true    Ran Out of Food in the Last Year: Never true  Transportation Needs: No Transportation Needs (10/28/2023)   PRAPARE - Administrator, Civil Service (Medical): No    Lack of Transportation (Non-Medical): No  Physical Activity: Not on file  Stress: Not on file  Social Connections: Moderately Integrated (10/28/2023)   Social Connection and Isolation Panel    Frequency of Communication with Friends and Family: More than three times a week    Frequency of Social Gatherings with Friends and Family: More than three times a week  Attends Religious Services: More than 4 times per year    Active Member of Clubs or Organizations: No    Attends Banker Meetings: Never    Marital Status: Married  Catering manager Violence: Not At Risk (10/28/2023)   Humiliation, Afraid, Rape, and Kick questionnaire    Fear of Current or Ex-Partner: No    Emotionally Abused: No    Physically Abused: No    Sexually Abused: No    Review of Systems: As per HPI Physical Exam: Vital signs in last 24 hours: Temp:  [97.5 F (36.4 C)-98.5 F (36.9 C)] 97.5 F (36.4 C) (10/19 0935) Pulse Rate:  [61-74] 64 (10/19 0935) Resp:  [12-25] 16 (10/19 0816) BP: (122-141)/(67-79) 141/74 (10/19 0935) SpO2:  [98 %-100 %] 100 % (10/19 0935) Weight:  [80.3 kg-81.6 kg] 81.6 kg (10/19 0928) Last BM Date : 10/27/23  General:   Alert,  Well-developed, well-nourished, pleasant and cooperative in NAD Head:  Normocephalic and atraumatic. Eyes:  Sclera clear, no icterus.   Conjunctiva pink. Ears:  Normal auditory acuity. Nose:  No deformity, discharge,  or lesions. Mouth:  No deformity or lesions.  Oropharynx pink & moist. Neck:   Supple; no masses or thyromegaly. Lungs:  Clear throughout to auscultation.   No wheezes, crackles, or rhonchi. No acute distress. Heart:  Regular rate and rhythm; no murmurs, clicks, rubs,  or gallops. Extremities:  Without clubbing or edema. Neurologic:  Alert and  oriented x4;  grossly normal neurologically. Skin:  Intact without significant lesions or rashes. Psych:  Alert and cooperative. Normal mood and affect. Abdomen: Soft but diffuse tenderness appreciated specially in epigastric, upper abdominal and lower abdominal area, bowel sounds audible        Lab Results: Recent Labs    10/27/23 1830 10/28/23 0821  WBC 7.1 5.6  HGB 14.7 12.9*  HCT 43.1 37.0*  PLT 152 107*   BMET Recent Labs    10/27/23 1830 10/28/23 0821  NA 138 140  K 4.0 3.6  CL 104 111  CO2 23 20*  GLUCOSE 108* 85  BUN 8 7*  CREATININE 0.85 0.71  CALCIUM  9.4 7.3*   LFT Recent Labs    10/28/23 0821  PROT 5.3*  ALBUMIN 3.0*  AST 19  ALT 12  ALKPHOS 58  BILITOT 1.2   PT/INR No results for input(s): LABPROT, INR in the last 72 hours.  Studies/Results: MR ABDOMEN MRCP W WO CONTAST Result Date: 10/28/2023 CLINICAL DATA:  Abdominal pain.  Pancreatitis suspected. EXAM: MRI ABDOMEN WITHOUT AND WITH CONTRAST (INCLUDING MRCP) TECHNIQUE: Multiplanar multisequence MR imaging of the abdomen was performed both before and after the administration of intravenous contrast. Heavily T2-weighted images of the biliary and pancreatic ducts were obtained, and three-dimensional MRCP images were rendered by post processing. CONTRAST:  8mL GADAVIST GADOBUTROL 1 MMOL/ML IV SOLN COMPARISON:  Abdomen pelvis CT 10/27/2023 FINDINGS: Lower chest: No acute findings Hepatobiliary: No suspicious focal abnormality within the liver parenchyma. There is no evidence for gallstones, gallbladder wall thickening, or pericholecystic fluid. No intrahepatic or extrahepatic biliary dilation. Common bile duct measures 3 mm in the head of  the pancreas. Pancreas: Assessment of the pancreas is degraded by motion artifact. Increased T2 signal is seen in around the pancreatic head suggesting edema main pancreatic duct is dilated up to 7 mm in the body and tail of the pancreas and gradually tapers into the ampulla. Numerous pancreatic parenchymal cystic foci identified through the body and tail of pancreas with dominant cystic  lesions in the tip of the pancreatic tail measuring up to 2.1 cm. No suspicious differential enhancement in the head of the pancreas and no discrete focal pancreatic mass lesion evident. Spleen:  No splenomegaly. No suspicious focal mass lesion. Adrenals/Urinary Tract: No adrenal nodule or mass. Kidneys unremarkable. Stomach/Bowel: Stomach is unremarkable. No gastric wall thickening. No evidence of outlet obstruction. Duodenum is normally positioned as is the ligament of Treitz. No small bowel or colonic dilatation within the visualized abdomen. Vascular/Lymphatic: No abdominal aortic aneurysm. No abdominal lymphadenopathy. Other:  No substantial ascites. Musculoskeletal: No focal suspicious marrow enhancement within the visualized bony anatomy. IMPRESSION: 1. Assessment of the pancreas is degraded by motion artifact. Increased T2 signal is seen in around the pancreatic head suggesting edema. Imaging features compatible with acute pancreatitis. 2. Main pancreatic duct is dilated up to 7 mm in the body and tail of the pancreas and gradually tapers through the head of the pancreas into the ampulla. No discrete pancreatic mass lesion evident by MRI although assessment is limited. Given the attenuated appearance of the main pancreatic duct through the head of the pancreas in the background T2 signal, endoscopic ultrasound recommended to further evaluate and more definitively exclude underlying pancreatic head mass lesion. 3. Numerous pancreatic parenchymal cystic foci identified through the body and tail of pancreas with dominant cystic  lesions in the tip of the pancreatic tail measuring up to 2.1 cm. Underlying IPMN not excluded. 4. No intra or extrahepatic biliary dilation. No evidence for cholelithiasis or choledocholithiasis. Electronically Signed   By: Camellia Candle M.D.   On: 10/28/2023 09:41   CT ABDOMEN PELVIS W CONTRAST Result Date: 10/27/2023 CLINICAL DATA:  Abdominal pain. EXAM: CT ABDOMEN AND PELVIS WITH CONTRAST TECHNIQUE: Multidetector CT imaging of the abdomen and pelvis was performed using the standard protocol following bolus administration of intravenous contrast. RADIATION DOSE REDUCTION: This exam was performed according to the departmental dose-optimization program which includes automated exposure control, adjustment of the mA and/or kV according to patient size and/or use of iterative reconstruction technique. CONTRAST:  75mL OMNIPAQUE IOHEXOL 350 MG/ML SOLN COMPARISON:  CT abdomen pelvis dated 12/11/2005. FINDINGS: Lower chest: Scattered small calcified granuloma. No intra-abdominal free air.  Small free fluid in the pelvis. Hepatobiliary: The liver is unremarkable. No biliary ductal dilatation. The gallbladder is unremarkable Pancreas: There is dilatation of the main pancreatic duct measuring up to 5 mm in diameter. Multiple small cystic lesions primarily in the tail of the pancreas are not characterized on this CT, possibly side branch IPMNs. Further evaluation of the pancreas with MRI without and with contrast on a nonemergent/outpatient basis recommended. Mild peripancreatic stranding. Correlation with pancreatic enzymes recommended to exclude acute pancreatitis. Spleen: Normal in size without focal abnormality. Adrenals/Urinary Tract: The adrenal glands unremarkable. The kidneys, visualized ureters, and urinary bladder appear unremarkable. Stomach/Bowel: There is mild sigmoid diverticulosis. There is no bowel obstruction or active inflammation. The appendix is normal. Vascular/Lymphatic: Mild aortoiliac  atherosclerotic disease. The IVC is unremarkable. No free gas. There is no adenopathy. Reproductive: The prostate and seminal vesicles are grossly remarkable. Other: None Musculoskeletal: Osteopenia with degenerative changes of the spine. No acute osseous pathology. IMPRESSION: 1. Mild peripancreatic stranding. Correlation with pancreatic enzymes recommended to exclude acute pancreatitis. 2. Dilatation of the pancreatic duct with multiple small cystic pancreatic lesions. Further evaluation of the pancreas with MRI without and with contrast on a nonemergent/outpatient basis recommended. 3. Mild sigmoid diverticulosis. No bowel obstruction. Normal appendix. 4.  Aortic Atherosclerosis (ICD10-I70.0). Electronically Signed  By: Vanetta Chou M.D.   On: 10/27/2023 20:18   DG Chest 2 View Result Date: 10/27/2023 CLINICAL DATA:  Chest pain. EXAM: CHEST - 2 VIEW COMPARISON:  05/10/2023. FINDINGS: The heart size and mediastinal contours are within normal limits. There is atherosclerotic calcification of the aorta. Chronic elevation left diaphragm is noted. No consolidation, effusion, or pneumothorax is seen. Degenerative changes are noted in the thoracolumbar spine. No acute osseous abnormality. IMPRESSION: No active cardiopulmonary disease. Electronically Signed   By: Leita Birmingham M.D.   On: 10/27/2023 19:20    Impression: Elevated lipase, changes of pancreatic inflammation and abdominal pain, clinical picture compatible with acute pancreatitis?  Etiology unclear(does not drink alcohol, does not have gallstones, triglycerides are normal, has not been started on any new medications)  MRI abdomen with and without contrast MRCP: Pancreatic head edema, main pancreatic duct dilated to 7 mm in body and tail of pancreas and gradually tapers into ampulla, numerous pancreatic parenchymal cystic foci throughout body and tail of pancreas with dominant cystic lesion into both pancreatic tail measuring up to 2.1 cm,  endoscopic ultrasound recommended   CT abdomen with contrast with contrast: Mild peripancreatic stranding, possible acute pancreatitis, dilation of pancreatic duct up to 5 mm with multiple small cystic pancreatic lesions, possible sidebranch IPMNs ,recommend MRI, unremarkable gallbladder  CT abdomen lipase 323 on admission yesterday, 80 today Normal LFTs, T. bili 1.2/AST 19/ALT 12/ALP 58  Normal triglycerides of 19  Plan: Treat pancreatitis with IV fluids, pain management and early enteric feeding if tolerated. Agree with normal saline at 125 mL/h, on morphine 1 to 4 mg every 4 hours as needed pain, was given fentanyl  50 mcg IV x 1 today morning at 5 AM and is on oxycodone  5 mg every 4 hours as needed moderate pain, also received oxycodone /acetaminophen  5/325, 1 tablet yesterday evening. Will start on clear liquid diet. Eventually patient will need an endoscopic ultrasound for further evaluation. Dr. Kriss to follow in a.m..   LOS: 0 days   Estelita Manas, MD  10/28/2023, 10:39 AM

## 2023-10-29 DIAGNOSIS — K859 Acute pancreatitis without necrosis or infection, unspecified: Secondary | ICD-10-CM | POA: Diagnosis not present

## 2023-10-29 LAB — CBC
HCT: 40.6 % (ref 39.0–52.0)
Hemoglobin: 13.8 g/dL (ref 13.0–17.0)
MCH: 32.1 pg (ref 26.0–34.0)
MCHC: 34 g/dL (ref 30.0–36.0)
MCV: 94.4 fL (ref 80.0–100.0)
Platelets: 101 K/uL — ABNORMAL LOW (ref 150–400)
RBC: 4.3 MIL/uL (ref 4.22–5.81)
RDW: 12.2 % (ref 11.5–15.5)
WBC: 7.5 K/uL (ref 4.0–10.5)
nRBC: 0 % (ref 0.0–0.2)

## 2023-10-29 LAB — COMPREHENSIVE METABOLIC PANEL WITH GFR
ALT: 13 U/L (ref 0–44)
AST: 21 U/L (ref 15–41)
Albumin: 3.3 g/dL — ABNORMAL LOW (ref 3.5–5.0)
Alkaline Phosphatase: 63 U/L (ref 38–126)
Anion gap: 10 (ref 5–15)
BUN: 9 mg/dL (ref 8–23)
CO2: 22 mmol/L (ref 22–32)
Calcium: 8.6 mg/dL — ABNORMAL LOW (ref 8.9–10.3)
Chloride: 103 mmol/L (ref 98–111)
Creatinine, Ser: 0.91 mg/dL (ref 0.61–1.24)
GFR, Estimated: >60 mL/min (ref >60)
Glucose, Bld: 90 mg/dL (ref 70–99)
Potassium: 4.4 mmol/L (ref 3.5–5.1)
Sodium: 135 mmol/L (ref 135–145)
Total Bilirubin: 1.7 mg/dL — ABNORMAL HIGH (ref 0.0–1.2)
Total Protein: 6.2 g/dL — ABNORMAL LOW (ref 6.5–8.1)

## 2023-10-29 MED ORDER — LACTATED RINGERS IV SOLN: INTRAVENOUS | Status: DC

## 2023-10-29 MED ORDER — ORAL CARE MOUTH RINSE: 15.0000 mL | OROMUCOSAL | Status: DC | PRN

## 2023-10-29 NOTE — Progress Notes (Signed)
  Progress Note   Patient: Steven Harrison FMW:[id number] DOB: 09/19/1945 DOA: 10/27/2023     1 DOS: the patient was seen and examined on 10/29/2023   Brief hospital course: 78 y.o. male with medical history significant of CAD status post stent on Lipitor, BPH, dyslipidemia who presented initially to urgent care with complaint of abdominal chest pain associated with diarrhea.  Was advised by urgent care to present to [facility] ED.  He reports that he has been having headache, left upper quadrant abdominal pain that radiates down to the mid abdomen to the right and through the back as well as associated diarrhea intermittently since last Wednesday but has become increasingly worse.  Has been having high-volume watery diarrhea.  Pain exacerbated and brought on by eating food.  He has had similar very mild episodes on and off for the past few years.  In the ED his LFTs were normal.  His lipase was elevated at 323.  CT abdomen and pelvis revealed mild peripancreatic stranding concerning for pancreatitis.  There was also dilatation of the pancreatic duct with multiple small cystic pancreatic lesions with further evaluation on a nonemergent basis of the pancreas with MRI.  Patient continues to have significant abdominal pain level 7 out of 10 that has improved somewhat with narcotic pain medications.  Hospitalist service has been asked to evaluate the patient for admission.   Assessment and Plan: Acute pancreatitis Pancreatic ductal dilatation, rule out IPMN -Lipase has normalized -MR abd reviewed which confirms ductal dilation without discrete pancreatic mass seen. Numerous pancreatic cystic lesions were noted -GI following. Plan for eventual outpot EUS -Plan to advance diet as tolerated. Still having some abd discomfort today, but did tolerate some clears   Dehydration/metabolic acidosis with normal anion gap -improved with IVF   Acute thrombocytopenia Likely reactive and possibly consumptive related to  acute illness -Plts stable   CAD History of stent previously on aspirin and currently maintained on Lipitor per patient Currently asymptomatic without any chest pain or shortness of breath Resume Lipitor when diet advanced   BPH Continue Flomax   Dyslipidemia Lipitor as above Lipid panel this admission demonstrates suboptimal HDL cholesterol of 24   Normocytic anemia Hemodynamically stable       Subjective: Still having some abd and back pains this AM  Physical Exam: Vitals:   10/28/23 1554 10/28/23 2039 10/28/23 2239 10/29/23 0424  BP: 132/71 131/68  127/72  Pulse: 66 73  (!) 59  Resp: 18 17  18   Temp: 98.4 F (36.9 C) (!) 100.6 F (38.1 C) 99 F (37.2 C) 97.9 F (36.6 C)  TempSrc:  Oral Oral   SpO2:  99%  98%  Weight:      Height:       General exam: Awake, laying in bed, in nad Respiratory system: Normal respiratory effort, no wheezing Cardiovascular system: regular rate, s1, s2 Gastrointestinal system: Soft, nondistended, positive BS Central nervous system: CN2-12 grossly intact, strength intact Extremities: Perfused, no clubbing Skin: Normal skin turgor, no notable skin lesions seen Psychiatry: Mood normal // no visual hallucinations   Data Reviewed:  Labs reviewed: na 135, K 4.4, Cr 0.91, WBC 7.5, Hgb 13.8, Plts 101  Family Communication: Pt in room, family at bedside  Disposition: Status is: Inpatient Remains inpatient appropriate because: severity of illness  Planned Discharge Destination: Home    Author: Garnette Pelt, MD 10/29/2023 4:13 PM  For on call review [email or web address].

## 2023-10-29 NOTE — Hospital Course (Signed)
 78 y.o. male with medical history significant of CAD status post stent on Lipitor, BPH, dyslipidemia who presented initially to urgent care with complaint of abdominal chest pain associated with diarrhea.  Was advised by urgent care to present to [facility] ED.  He reports that he has been having headache, left upper quadrant abdominal pain that radiates down to the mid abdomen to the right and through the back as well as associated diarrhea intermittently since last Wednesday but has become increasingly worse.  Has been having high-volume watery diarrhea.  Pain exacerbated and brought on by eating food.  He has had similar very mild episodes on and off for the past few years.  In the ED his LFTs were normal.  His lipase was elevated at 323.  CT abdomen and pelvis revealed mild peripancreatic stranding concerning for pancreatitis.  There was also dilatation of the pancreatic duct with multiple small cystic pancreatic lesions with further evaluation on a nonemergent basis of the pancreas with MRI.  Patient continues to have significant abdominal pain level 7 out of 10 that has improved somewhat with narcotic pain medications.  Hospitalist service has been asked to evaluate the patient for admission.

## 2023-10-29 NOTE — Progress Notes (Signed)
 [facility] Progress Note  SUBJECTIVE:   Interval history: Steven Harrison was seen and evaluated today at bedside. Spouse at bedside. Noted abdominal pain 6/10 intensity. Worse in lumbar spine. No nausea or vomiting. No bowel movement recently. No chest pain or shortness of breath. Urinary hesitancy.   Past Medical History:  Diagnosis Date   Cancer (HCC)    SKIN CANCER EAR AND FOREHEAD , FROZEN OFF   Coronary artery disease    coronary stent - 1999 - released by carsdiologist several years ago per patient , 06-24-2018 DENIES ACUTE CARDIAC SX    Hemorrhoids    Past Surgical History:  Procedure Laterality Date   COLONOSCOPY N/A 12/13/2017   Procedure: COLONOSCOPY;  Surgeon: Teresa Lonni HERO, MD;  Location: [facility];  Service: General;  Laterality: N/A;   COLONOSCOPY WITH PROPOFOL  N/A 02/08/2016   Procedure: COLONOSCOPY WITH PROPOFOL ;  Surgeon: Gladis MARLA Louder, MD;  Location: [facility];  Service: Endoscopy;  Laterality: N/A;   HEMORRHOID SURGERY N/A 06/26/2018   Procedure: HEMORRHOIDECTOMY;  Surgeon: Teresa Lonni HERO, MD;  Location: [facility];  Service: General;  Laterality: N/A;   NO PAST SURGERIES     Patient had a stent palced in 1999   POLYPECTOMY  12/13/2017   Procedure: POLYPECTOMY;  Surgeon: Teresa Lonni HERO, MD;  Location: [facility];  Service: General;;   RECTAL EXAM UNDER ANESTHESIA N/A 06/26/2018   Procedure: ANORECTAL EXAM UNDER ANESTHESIA;  Surgeon: Teresa Lonni HERO, MD;  Location: [facility];  Service: General;  Laterality: N/A;   Current Facility-Administered Medications  Medication Dose Route Frequency Provider Last Rate Last Admin   acetaminophen  (TYLENOL ) tablet 650 mg  650 mg Oral Q6H PRN Opyd, Timothy S, MD   650 mg at 10/28/23 2049   enoxaparin (LOVENOX) injection 40 mg  40 mg Subcutaneous Q24H Alto Isaiah CROME, NP   40 mg at 10/28/23 1300   feeding supplement (BOOST / RESOURCE BREEZE) liquid 1  Container  1 Container Oral TID BM Alto Isaiah CROME, NP   1 Container at 10/29/23 1000   morphine (PF) 2 MG/ML injection 1-4 mg  1-4 mg Intravenous Q2H PRN Alto Isaiah CROME, NP   2 mg at 10/28/23 1557   Oral care mouth rinse  15 mL Mouth Rinse PRN Cindy Garnette MARLA, MD       oxyCODONE  (Oxy IR/ROXICODONE ) immediate release tablet 5 mg  5 mg Oral Q4H PRN Opyd, Timothy S, MD   5 mg at 10/29/23 1117   prochlorperazine (COMPAZINE) injection 5 mg  5 mg Intravenous Q4H PRN Opyd, Timothy S, MD       sodium chloride  flush (NS) 0.9 % injection 3 mL  3 mL Intravenous Q12H Alto Isaiah CROME, NP   3 mL at 10/29/23 1000   tamsulosin (FLOMAX) capsule 0.4 mg  0.4 mg Oral QPC supper Alto Isaiah CROME, NP       Allergies as of 10/27/2023   (No Known Allergies)   Review of Systems:  Review of Systems  Respiratory:  Negative for shortness of breath.   Cardiovascular:  Negative for chest pain.  Gastrointestinal:  Positive for abdominal pain. Negative for nausea and vomiting.    OBJECTIVE:   Temp:  [97.9 F (36.6 C)-100.6 F (38.1 C)] 97.9 F (36.6 C) (10/20 0424) Pulse Rate:  [59-73] 59 (10/20 0424) Resp:  [17-18] 18 (10/20 0424) BP: (127-132)/(68-72) 127/72 (10/20 0424) SpO2:  [98 %-99 %] 98 % (10/20 0424) Last BM Date : 10/26/23  Physical Exam Constitutional:      General: He is not in acute distress.    Appearance: He is normal weight. He is not ill-appearing, toxic-appearing or diaphoretic.  Cardiovascular:     Rate and Rhythm: Normal rate and regular rhythm.  Pulmonary:     Effort: No respiratory distress.     Breath sounds: Normal breath sounds.  Abdominal:     General: Bowel sounds are normal. There is no distension.     Palpations: Abdomen is soft.     Tenderness: There is abdominal tenderness. There is no guarding.  Neurological:     Mental Status: He is alert.     Labs: Recent Labs    10/27/23 1830 10/28/23 0821 10/29/23 0517  WBC 7.1 5.6 7.5  HGB 14.7 12.9* 13.8  HCT 43.1 37.0*  40.6  PLT 152 107* 101*   BMET Recent Labs    10/27/23 1830 10/28/23 0821 10/29/23 0517  NA 138 140 135  K 4.0 3.6 4.4  CL 104 111 103  CO2 23 20* 22  GLUCOSE 108* 85 90  BUN 8 7* 9  CREATININE 0.85 0.71 0.91  CALCIUM  9.4 7.3* 8.6*   LFT Recent Labs    10/29/23 0517  PROT 6.2*  ALBUMIN 3.3*  AST 21  ALT 13  ALKPHOS 63  BILITOT 1.7*   PT/INR No results for input(s): LABPROT, INR in the last 72 hours. Diagnostic imaging: MR ABDOMEN MRCP W WO CONTAST Result Date: 10/28/2023 CLINICAL DATA:  Abdominal pain.  Pancreatitis suspected. EXAM: MRI ABDOMEN WITHOUT AND WITH CONTRAST (INCLUDING MRCP) TECHNIQUE: Multiplanar multisequence MR imaging of the abdomen was performed both before and after the administration of intravenous contrast. Heavily T2-weighted images of the biliary and pancreatic ducts were obtained, and three-dimensional MRCP images were rendered by post processing. CONTRAST:  8mL GADAVIST GADOBUTROL 1 MMOL/ML IV SOLN COMPARISON:  Abdomen pelvis CT 10/27/2023 FINDINGS: Lower chest: No acute findings Hepatobiliary: No suspicious focal abnormality within the liver parenchyma. There is no evidence for gallstones, gallbladder wall thickening, or pericholecystic fluid. No intrahepatic or extrahepatic biliary dilation. Common bile duct measures 3 mm in the head of the pancreas. Pancreas: Assessment of the pancreas is degraded by motion artifact. Increased T2 signal is seen in around the pancreatic head suggesting edema main pancreatic duct is dilated up to 7 mm in the body and tail of the pancreas and gradually tapers into the ampulla. Numerous pancreatic parenchymal cystic foci identified through the body and tail of pancreas with dominant cystic lesions in the tip of the pancreatic tail measuring up to 2.1 cm. No suspicious differential enhancement in the head of the pancreas and no discrete focal pancreatic mass lesion evident. Spleen:  No splenomegaly. No suspicious focal mass  lesion. Adrenals/Urinary Tract: No adrenal nodule or mass. Kidneys unremarkable. Stomach/Bowel: Stomach is unremarkable. No gastric wall thickening. No evidence of outlet obstruction. Duodenum is normally positioned as is the ligament of Treitz. No small bowel or colonic dilatation within the visualized abdomen. Vascular/Lymphatic: No abdominal aortic aneurysm. No abdominal lymphadenopathy. Other:  No substantial ascites. Musculoskeletal: No focal suspicious marrow enhancement within the visualized bony anatomy. IMPRESSION: 1. Assessment of the pancreas is degraded by motion artifact. Increased T2 signal is seen in around the pancreatic head suggesting edema. Imaging features compatible with acute pancreatitis. 2. Main pancreatic duct is dilated up to 7 mm in the body and tail of the pancreas and gradually tapers through the head of the pancreas into the ampulla. No discrete pancreatic  mass lesion evident by MRI although assessment is limited. Given the attenuated appearance of the main pancreatic duct through the head of the pancreas in the background T2 signal, endoscopic ultrasound recommended to further evaluate and more definitively exclude underlying pancreatic head mass lesion. 3. Numerous pancreatic parenchymal cystic foci identified through the body and tail of pancreas with dominant cystic lesions in the tip of the pancreatic tail measuring up to 2.1 cm. Underlying IPMN not excluded. 4. No intra or extrahepatic biliary dilation. No evidence for cholelithiasis or choledocholithiasis. Electronically Signed   By: Camellia Candle M.D.   On: 10/28/2023 09:41   CT ABDOMEN PELVIS W CONTRAST Result Date: 10/27/2023 CLINICAL DATA:  Abdominal pain. EXAM: CT ABDOMEN AND PELVIS WITH CONTRAST TECHNIQUE: Multidetector CT imaging of the abdomen and pelvis was performed using the standard protocol following bolus administration of intravenous contrast. RADIATION DOSE REDUCTION: This exam was performed according to the  departmental dose-optimization program which includes automated exposure control, adjustment of the mA and/or kV according to patient size and/or use of iterative reconstruction technique. CONTRAST:  75mL OMNIPAQUE IOHEXOL 350 MG/ML SOLN COMPARISON:  CT abdomen pelvis dated 12/11/2005. FINDINGS: Lower chest: Scattered small calcified granuloma. No intra-abdominal free air.  Small free fluid in the pelvis. Hepatobiliary: The liver is unremarkable. No biliary ductal dilatation. The gallbladder is unremarkable Pancreas: There is dilatation of the main pancreatic duct measuring up to 5 mm in diameter. Multiple small cystic lesions primarily in the tail of the pancreas are not characterized on this CT, possibly side branch IPMNs. Further evaluation of the pancreas with MRI without and with contrast on a nonemergent/outpatient basis recommended. Mild peripancreatic stranding. Correlation with pancreatic enzymes recommended to exclude acute pancreatitis. Spleen: Normal in size without focal abnormality. Adrenals/Urinary Tract: The adrenal glands unremarkable. The kidneys, visualized ureters, and urinary bladder appear unremarkable. Stomach/Bowel: There is mild sigmoid diverticulosis. There is no bowel obstruction or active inflammation. The appendix is normal. Vascular/Lymphatic: Mild aortoiliac atherosclerotic disease. The IVC is unremarkable. No free gas. There is no adenopathy. Reproductive: The prostate and seminal vesicles are grossly remarkable. Other: None Musculoskeletal: Osteopenia with degenerative changes of the spine. No acute osseous pathology. IMPRESSION: 1. Mild peripancreatic stranding. Correlation with pancreatic enzymes recommended to exclude acute pancreatitis. 2. Dilatation of the pancreatic duct with multiple small cystic pancreatic lesions. Further evaluation of the pancreas with MRI without and with contrast on a nonemergent/outpatient basis recommended. 3. Mild sigmoid diverticulosis. No bowel  obstruction. Normal appendix. 4.  Aortic Atherosclerosis (ICD10-I70.0). Electronically Signed   By: Vanetta Chou M.D.   On: 10/27/2023 20:18   DG Chest 2 View Result Date: 10/27/2023 CLINICAL DATA:  Chest pain. EXAM: CHEST - 2 VIEW COMPARISON:  05/10/2023. FINDINGS: The heart size and mediastinal contours are within normal limits. There is atherosclerotic calcification of the aorta. Chronic elevation left diaphragm is noted. No consolidation, effusion, or pneumothorax is seen. Degenerative changes are noted in the thoracolumbar spine. No acute osseous abnormality. IMPRESSION: No active cardiopulmonary disease. Electronically Signed   By: Leita Birmingham M.D.   On: 10/27/2023 19:20   IMPRESSION: Acute pancreatitis, etiology undetermined Pancreatic ductal dilatation, 7 mm Pancreatic cystic lesion Abdominal pain secondary to #1  PLAN: -Continue supportive care measures, IV fluids, pain control -Can advance to low fat diet as tolerated -Recommend outpatient endoscopic ultrasound with Dr. Burnette, can follow up in office to discuss, patient and his spouse aware -[facility] will follow   LOS: 1 day   Estefana Keas, DO [facility]

## 2023-10-30 DIAGNOSIS — K859 Acute pancreatitis without necrosis or infection, unspecified: Secondary | ICD-10-CM | POA: Diagnosis not present

## 2023-10-30 LAB — COMPREHENSIVE METABOLIC PANEL WITH GFR
ALT: 13 U/L (ref 0–44)
AST: 23 U/L (ref 15–41)
Albumin: 3.1 g/dL — ABNORMAL LOW (ref 3.5–5.0)
Alkaline Phosphatase: 64 U/L (ref 38–126)
Anion gap: 8 (ref 5–15)
BUN: 6 mg/dL — ABNORMAL LOW (ref 8–23)
CO2: 24 mmol/L (ref 22–32)
Calcium: 8.4 mg/dL — ABNORMAL LOW (ref 8.9–10.3)
Chloride: 103 mmol/L (ref 98–111)
Creatinine, Ser: 0.77 mg/dL (ref 0.61–1.24)
GFR, Estimated: >60 mL/min (ref >60)
Glucose, Bld: 138 mg/dL — ABNORMAL HIGH (ref 70–99)
Potassium: 3.6 mmol/L (ref 3.5–5.1)
Sodium: 135 mmol/L (ref 135–145)
Total Bilirubin: 1.3 mg/dL — ABNORMAL HIGH (ref 0.0–1.2)
Total Protein: 6 g/dL — ABNORMAL LOW (ref 6.5–8.1)

## 2023-10-30 MED ORDER — POLYETHYLENE GLYCOL 3350 17 G PO PACK
17.0000 g | PACK | Freq: Every day | ORAL | Status: DC | PRN
Start: 2023-10-30 — End: 2023-10-31
  Administered 2023-10-30: 17 g via ORAL
  Filled 2023-10-30: qty 1

## 2023-10-30 MED ORDER — ATORVASTATIN CALCIUM 10 MG PO TABS
20.0000 mg | ORAL_TABLET | Freq: Every day | ORAL | Status: DC
  Administered 2023-10-30 – 2023-10-31 (×2): 20 mg via ORAL
  Filled 2023-10-30 (×2): qty 2

## 2023-10-30 MED ORDER — ALBUTEROL SULFATE HFA 108 (90 BASE) MCG/ACT IN AERS: 1.0000 | INHALATION_SPRAY | RESPIRATORY_TRACT | Status: DC | PRN

## 2023-10-30 NOTE — Progress Notes (Signed)
 [facility] Progress Note  SUBJECTIVE:   Interval history: Steven Harrison was seen and evaluated today at bedside. Feeling improved, abdominal pain 3/10 intensity. Able to tolerate some soup and jello. Maintenance IV fluids in place. No bowel movement, passing flatus. No chest pain or shortness of breath.   Past Medical History:  Diagnosis Date   Cancer (HCC)    SKIN CANCER EAR AND FOREHEAD , FROZEN OFF   Coronary artery disease    coronary stent - 1999 - released by carsdiologist several years ago per patient , 06-24-2018 DENIES ACUTE CARDIAC SX    Hemorrhoids    Past Surgical History:  Procedure Laterality Date   COLONOSCOPY N/A 12/13/2017   Procedure: COLONOSCOPY;  Surgeon: Teresa Lonni HERO, MD;  Location: [facility];  Service: General;  Laterality: N/A;   COLONOSCOPY WITH PROPOFOL  N/A 02/08/2016   Procedure: COLONOSCOPY WITH PROPOFOL ;  Surgeon: Gladis MARLA Louder, MD;  Location: [facility];  Service: Endoscopy;  Laterality: N/A;   HEMORRHOID SURGERY N/A 06/26/2018   Procedure: HEMORRHOIDECTOMY;  Surgeon: Teresa Lonni HERO, MD;  Location: [facility];  Service: General;  Laterality: N/A;   NO PAST SURGERIES     Patient had a stent palced in 1999   POLYPECTOMY  12/13/2017   Procedure: POLYPECTOMY;  Surgeon: Teresa Lonni HERO, MD;  Location: [facility];  Service: General;;   RECTAL EXAM UNDER ANESTHESIA N/A 06/26/2018   Procedure: ANORECTAL EXAM UNDER ANESTHESIA;  Surgeon: Teresa Lonni HERO, MD;  Location: [facility];  Service: General;  Laterality: N/A;   Current Facility-Administered Medications  Medication Dose Route Frequency Provider Last Rate Last Admin   acetaminophen  (TYLENOL ) tablet 650 mg  650 mg Oral Q6H PRN Opyd, Timothy S, MD   650 mg at 10/28/23 2049   enoxaparin (LOVENOX) injection 40 mg  40 mg Subcutaneous Q24H Alto Isaiah CROME, NP   40 mg at 10/29/23 1253   feeding supplement (BOOST / RESOURCE BREEZE) liquid 1  Container  1 Container Oral TID BM Alto Isaiah CROME, NP   1 Container at 10/30/23 0831   lactated ringers  infusion   Intravenous Continuous Kriss Estefana DEL, DO 100 mL/hr at 10/29/23 2225 New Bag at 10/29/23 2225   morphine (PF) 2 MG/ML injection 1-4 mg  1-4 mg Intravenous Q2H PRN Alto Isaiah CROME, NP   2 mg at 10/28/23 1557   Oral care mouth rinse  15 mL Mouth Rinse PRN Cindy Garnette MARLA, MD       oxyCODONE  (Oxy IR/ROXICODONE ) immediate release tablet 5 mg  5 mg Oral Q4H PRN Opyd, Timothy S, MD   5 mg at 10/29/23 2218   prochlorperazine (COMPAZINE) injection 5 mg  5 mg Intravenous Q4H PRN Opyd, Evalene RAMAN, MD       sodium chloride  flush (NS) 0.9 % injection 3 mL  3 mL Intravenous Q12H Alto Isaiah CROME, NP   3 mL at 10/30/23 0831   tamsulosin (FLOMAX) capsule 0.4 mg  0.4 mg Oral QPC supper Alto Isaiah CROME, NP   0.4 mg at 10/29/23 2219   Allergies as of 10/27/2023   (No Known Allergies)   Review of Systems:  Review of Systems  Respiratory:  Negative for shortness of breath.   Cardiovascular:  Negative for chest pain.  Gastrointestinal:  Positive for abdominal pain. Negative for constipation, diarrhea, nausea and vomiting.    OBJECTIVE:   Temp:  [97.7 F (36.5 C)-98.6 F (37 C)] 98.6 F (37 C) (10/21 1003) Pulse Rate:  [  51-62] 51 (10/21 1003) Resp:  [16-19] 19 (10/21 1003) BP: (117-131)/(70-76) 117/71 (10/21 1003) SpO2:  [97 %-100 %] 99 % (10/21 1003) Last BM Date : 10/26/23 Physical Exam Constitutional:      General: He is not in acute distress.    Appearance: He is not ill-appearing, toxic-appearing or diaphoretic.  Cardiovascular:     Rate and Rhythm: Normal rate and regular rhythm.  Pulmonary:     Effort: No respiratory distress.  Abdominal:     General: Bowel sounds are normal. There is no distension.     Palpations: Abdomen is soft.     Tenderness: There is abdominal tenderness. There is no guarding.  Neurological:     Mental Status: He is alert.     Labs: Recent  Labs    10/27/23 1830 10/28/23 0821 10/29/23 0517  WBC 7.1 5.6 7.5  HGB 14.7 12.9* 13.8  HCT 43.1 37.0* 40.6  PLT 152 107* 101*   BMET Recent Labs    10/28/23 0821 10/29/23 0517 10/30/23 0829  NA 140 135 135  K 3.6 4.4 3.6  CL 111 103 103  CO2 20* 22 24  GLUCOSE 85 90 138*  BUN 7* 9 6*  CREATININE 0.71 0.91 0.77  CALCIUM  7.3* 8.6* 8.4*   LFT Recent Labs    10/30/23 0829  PROT 6.0*  ALBUMIN 3.1*  AST 23  ALT 13  ALKPHOS 64  BILITOT 1.3*   PT/INR No results for input(s): LABPROT, INR in the last 72 hours. Diagnostic imaging: No results found.  IMPRESSION: Acute pancreatitis, etiology undetermined Pancreatic ductal dilatation, 7 mm Pancreatic cystic lesion Abdominal pain secondary to #1, improved   PLAN: -Continue supportive care, if tolerates lunch ok to DC maintenance IV fluids and discharge from GI perspective -Needs outpatient follow up with [facility], Dr. Burnette, to discuss endoscopic ultrasound to further evaluation pancreatic ductal dilatation and pancreatic cystic lesion, will place information in chart for discharge   LOS: 2 days   Estefana Keas, [facility]

## 2023-10-30 NOTE — Progress Notes (Signed)
  Progress Note   Patient: Steven Harrison FMW:[id number] DOB: [birth year] DOA: 10/27/2023     2 DOS: the patient was seen and examined on 10/30/2023   Brief hospital course: 78 y.o. male with medical history significant of CAD status post stent on Lipitor, BPH, dyslipidemia who presented initially to urgent care with complaint of abdominal chest pain associated with diarrhea.  Was advised by urgent care to present to [facility] ED.  He reports that he has been having headache, left upper quadrant abdominal pain that radiates down to the mid abdomen to the right and through the back as well as associated diarrhea intermittently since last Wednesday but has become increasingly worse.  Has been having high-volume watery diarrhea.  Pain exacerbated and brought on by eating food.  He has had similar very mild episodes on and off for the past few years.  In the ED his LFTs were normal.  His lipase was elevated at 323.  CT abdomen and pelvis revealed mild peripancreatic stranding concerning for pancreatitis.  There was also dilatation of the pancreatic duct with multiple small cystic pancreatic lesions with further evaluation on a nonemergent basis of the pancreas with MRI.  Patient continues to have significant abdominal pain level 7 out of 10 that has improved somewhat with narcotic pain medications.  Hospitalist service has been asked to evaluate the patient for admission.   Assessment and Plan: Acute pancreatitis Pancreatic ductal dilatation, rule out IPMN -Lipase has normalized -MR abd reviewed which confirms ductal dilation without discrete pancreatic mass seen. Numerous pancreatic cystic lesions were noted -GI following. Plan for eventual outpot EUS -Plan to advance diet as tolerated. Diet has been advanced to regular this evening. RN reports continued mild abd discomfort this afternoon   Dehydration/metabolic acidosis with normal anion gap -improved with IVF   Acute thrombocytopenia Likely reactive and  possibly consumptive related to acute illness -Plts stable   CAD History of stent previously on aspirin and currently maintained on Lipitor per patient Currently asymptomatic without any chest pain or shortness of breath Resume Lipitor   BPH Continue Flomax   Dyslipidemia Lipitor as above Lipid panel this admission demonstrates suboptimal HDL cholesterol of 24   Normocytic anemia Hemodynamically stable       Subjective: Overall feeling much better, but is still having mild lower quadrant discomfort. Still a little low back discomfort as well  Physical Exam: Vitals:   10/29/23 0424 10/29/23 1950 10/30/23 0426 10/30/23 1003  BP: 127/72 131/76 117/70 117/71  Pulse: (!) 59 62 (!) 57 (!) 51  Resp: 18 18 16 19   Temp: 97.9 F (36.6 C) 98.1 F (36.7 C) 97.7 F (36.5 C) 98.6 F (37 C)  TempSrc:   Oral   SpO2: 98% 100% 97% 99%  Weight:      Height:       General exam: Conversant, in no acute distress Respiratory system: normal chest rise, clear, no audible wheezing Cardiovascular system: regular rhythm, s1-s2 Gastrointestinal system: Nondistended, nontender, pos BS Central nervous system: No seizures, no tremors Extremities: No cyanosis, no joint deformities Skin: No rashes, no pallor Psychiatry: Affect normal // no auditory hallucinations   Data Reviewed:  Labs reviewed: Na 135, K 3.6, Cr 0.77  Family Communication: Pt in room, family at bedside  Disposition: Status is: Inpatient Remains inpatient appropriate because: severity of illness  Planned Discharge Destination: Home    Author: Garnette Pelt, MD 10/30/2023 4:54 PM  For on call review [email or web address].

## 2023-10-31 DIAGNOSIS — K859 Acute pancreatitis without necrosis or infection, unspecified: Secondary | ICD-10-CM | POA: Diagnosis not present

## 2023-10-31 LAB — COMPREHENSIVE METABOLIC PANEL WITH GFR
ALT: 19 U/L (ref 0–44)
AST: 26 U/L (ref 15–41)
Albumin: 3.2 g/dL — ABNORMAL LOW (ref 3.5–5.0)
Alkaline Phosphatase: 59 U/L (ref 38–126)
Anion gap: 6 (ref 5–15)
BUN: 6 mg/dL — ABNORMAL LOW (ref 8–23)
CO2: 26 mmol/L (ref 22–32)
Calcium: 8.4 mg/dL — ABNORMAL LOW (ref 8.9–10.3)
Chloride: 104 mmol/L (ref 98–111)
Creatinine, Ser: 0.69 mg/dL (ref 0.61–1.24)
GFR, Estimated: >60 mL/min (ref >60)
Glucose, Bld: 113 mg/dL — ABNORMAL HIGH (ref 70–99)
Potassium: 3.8 mmol/L (ref 3.5–5.1)
Sodium: 136 mmol/L (ref 135–145)
Total Bilirubin: 0.8 mg/dL (ref 0.0–1.2)
Total Protein: 5.9 g/dL — ABNORMAL LOW (ref 6.5–8.1)

## 2023-10-31 LAB — CBC
HCT: 36 % — ABNORMAL LOW (ref 39.0–52.0)
Hemoglobin: 12.7 g/dL — ABNORMAL LOW (ref 13.0–17.0)
MCH: 32.4 pg (ref 26.0–34.0)
MCHC: 35.3 g/dL (ref 30.0–36.0)
MCV: 91.8 fL (ref 80.0–100.0)
Platelets: 125 K/uL — ABNORMAL LOW (ref 150–400)
RBC: 3.92 MIL/uL — ABNORMAL LOW (ref 4.22–5.81)
RDW: 11.9 % (ref 11.5–15.5)
WBC: 3.6 K/uL — ABNORMAL LOW (ref 4.0–10.5)
nRBC: 0 % (ref 0.0–0.2)

## 2023-10-31 MED ORDER — OXYCODONE HCL 5 MG PO TABS: 5.0000 mg | ORAL_TABLET | ORAL | 0 refills | Status: AC | PRN

## 2023-10-31 MED ORDER — ACETAMINOPHEN 325 MG PO TABS: 650.0000 mg | ORAL_TABLET | Freq: Four times a day (QID) | ORAL | Status: AC | PRN

## 2023-10-31 NOTE — Discharge Summary (Incomplete)
 Physician Discharge Summary  Steven Harrison FMW:[id number] DOB: 31-Jan-1945 DOA: 10/27/2023  PCP: Leonel Cole, MD  Admit date: 10/27/2023 Discharge date: 10/31/2023  Time spent: 46 minutes  Recommendations for Outpatient Follow-up:  CC Dr. Kriss on patient discharge to set up outpatient endoscopic ultrasound Requires LFTs lipase etc. in about 1 week   Discharge Diagnoses:  MAIN problem for hospitalization ***  Please see below for itemized issues addressed in HOpsital- refer to other progress notes for clarity if needed  Discharge Condition: ***  Diet recommendation: ***  Filed Weights   10/27/23 1811 10/28/23 0928  Weight: 80.3 kg 81.6 kg    History of present illness:  78 year old white male Known underlying history of anal fissure BPH urinary retention in the past followed by Dr. Carolee Trigger finger of middle finger followed by Dr. Murrell CAD status post stent in 1999 on Lipitor  10/19 presented to emergency room with chest abdominal pain and message for him to have diarrhea and high-volume diarrhea Lipase was 323 CT suggestive pancreatitis with multiple small cystic lesions GI consulted and MRCP showed pancreatic duct dilatation to 7 mm with tapering into ampulla and numerous pancreatic parenchymal cystic foci throughout measuring up to 2.1 cm  Patient was treated aggressively for pancreatitis with IV fluids pain management and was placed on escalating opiates and eventually improved  GI evaluated the patient on 10/22 and felt that patient could potentially go home with close outpatient follow-up with GI for EUS which needs to be set up in the outpatient setting by [facility] Course:  ***  Procedures: *** (i.e. Studies not automatically included, echos, thoracentesis, etc; not x-rays)  Consultations: ***  Discharge Exam: Vitals:   10/31/23 0402 10/31/23 0841  BP: 130/66 124/67  Pulse: (!) 54 66  Resp:  19  Temp:  97.7 F (36.5 C)   SpO2: 98% 100%    Subj on day of d/c   ***  General Exam on discharge  ***  Discharge Instructions    Allergies as of 10/31/2023   No Known Allergies   Med Rec must be completed prior to using this SMARTLINK***      No Known Allergies  Follow-up Information     Leonel Cole, MD .   Specialty: Family Medicine Contact information: [address] [place] [address] [phone]         Burnette Fallow, MD. Schedule an appointment as soon as possible for a visit in 1 week(s).   Specialty: Gastroenterology Why: Call office to schedule appointment for hospital follow up after acute pancreatitis and to discuss endoscopic ultrasound with Dr. Burnette to investigate pancreas duct dilation. Contact information: [address] [place] [address] [phone]                  The results of significant diagnostics from this hospitalization (including imaging, microbiology, ancillary and laboratory) are listed below for reference.    Significant Diagnostic Studies: MR 3D Recon At Scanner Result Date: 10/29/2023 CLINICAL DATA:  Abdominal pain.  Pancreatitis suspected. EXAM: MRI ABDOMEN WITHOUT AND WITH CONTRAST (INCLUDING MRCP) TECHNIQUE: Multiplanar multisequence MR imaging of the abdomen was performed both before and after the administration of intravenous contrast. Heavily T2-weighted images of the biliary and pancreatic ducts were obtained, and three-dimensional MRCP images were rendered by post processing. CONTRAST:  8mL GADAVIST GADOBUTROL 1 MMOL/ML IV SOLN COMPARISON:  Abdomen pelvis CT 10/27/2023 FINDINGS: Lower chest: No acute findings Hepatobiliary: No suspicious  focal abnormality within the liver parenchyma. There is no evidence for gallstones, gallbladder wall thickening, or pericholecystic fluid. No intrahepatic or extrahepatic biliary dilation. Common bile duct measures 3 mm in the head of the pancreas. Pancreas:  Assessment of the pancreas is degraded by motion artifact. Increased T2 signal is seen in around the pancreatic head suggesting edema main pancreatic duct is dilated up to 7 mm in the body and tail of the pancreas and gradually tapers into the ampulla. Numerous pancreatic parenchymal cystic foci identified through the body and tail of pancreas with dominant cystic lesions in the tip of the pancreatic tail measuring up to 2.1 cm. No suspicious differential enhancement in the head of the pancreas and no discrete focal pancreatic mass lesion evident. Spleen:  No splenomegaly. No suspicious focal mass lesion. Adrenals/Urinary Tract: No adrenal nodule or mass. Kidneys unremarkable. Stomach/Bowel: Stomach is unremarkable. No gastric wall thickening. No evidence of outlet obstruction. Duodenum is normally positioned as is the ligament of Treitz. No small bowel or colonic dilatation within the visualized abdomen. Vascular/Lymphatic: No abdominal aortic aneurysm. No abdominal lymphadenopathy. Other:  No substantial ascites. Musculoskeletal: No focal suspicious marrow enhancement within the visualized bony anatomy. IMPRESSION: 1. Assessment of the pancreas is degraded by motion artifact. Increased T2 signal is seen in around the pancreatic head suggesting edema. Imaging features compatible with acute pancreatitis. 2. Main pancreatic duct is dilated up to 7 mm in the body and tail of the pancreas and gradually tapers through the head of the pancreas into the ampulla. No discrete pancreatic mass lesion evident by MRI although assessment is limited. Given the attenuated appearance of the main pancreatic duct through the head of the pancreas in the background T2 signal, endoscopic ultrasound recommended to further evaluate and more definitively exclude underlying pancreatic head mass lesion. 3. Numerous pancreatic parenchymal cystic foci identified through the body and tail of pancreas with dominant cystic lesions in the tip of  the pancreatic tail measuring up to 2.1 cm. Underlying IPMN not excluded. 4. No intra or extrahepatic biliary dilation. No evidence for cholelithiasis or choledocholithiasis. Electronically Signed   By: Camellia Candle M.D.   On: 10/29/2023 08:51   MR ABDOMEN MRCP W WO CONTAST Result Date: 10/28/2023 CLINICAL DATA:  Abdominal pain.  Pancreatitis suspected. EXAM: MRI ABDOMEN WITHOUT AND WITH CONTRAST (INCLUDING MRCP) TECHNIQUE: Multiplanar multisequence MR imaging of the abdomen was performed both before and after the administration of intravenous contrast. Heavily T2-weighted images of the biliary and pancreatic ducts were obtained, and three-dimensional MRCP images were rendered by post processing. CONTRAST:  8mL GADAVIST GADOBUTROL 1 MMOL/ML IV SOLN COMPARISON:  Abdomen pelvis CT 10/27/2023 FINDINGS: Lower chest: No acute findings Hepatobiliary: No suspicious focal abnormality within the liver parenchyma. There is no evidence for gallstones, gallbladder wall thickening, or pericholecystic fluid. No intrahepatic or extrahepatic biliary dilation. Common bile duct measures 3 mm in the head of the pancreas. Pancreas: Assessment of the pancreas is degraded by motion artifact. Increased T2 signal is seen in around the pancreatic head suggesting edema main pancreatic duct is dilated up to 7 mm in the body and tail of the pancreas and gradually tapers into the ampulla. Numerous pancreatic parenchymal cystic foci identified through the body and tail of pancreas with dominant cystic lesions in the tip of the pancreatic tail measuring up to 2.1 cm. No suspicious differential enhancement in the head of the pancreas and no discrete focal pancreatic mass lesion evident. Spleen:  No splenomegaly. No suspicious focal mass lesion.  Adrenals/Urinary Tract: No adrenal nodule or mass. Kidneys unremarkable. Stomach/Bowel: Stomach is unremarkable. No gastric wall thickening. No evidence of outlet obstruction. Duodenum is normally  positioned as is the ligament of Treitz. No small bowel or colonic dilatation within the visualized abdomen. Vascular/Lymphatic: No abdominal aortic aneurysm. No abdominal lymphadenopathy. Other:  No substantial ascites. Musculoskeletal: No focal suspicious marrow enhancement within the visualized bony anatomy. IMPRESSION: 1. Assessment of the pancreas is degraded by motion artifact. Increased T2 signal is seen in around the pancreatic head suggesting edema. Imaging features compatible with acute pancreatitis. 2. Main pancreatic duct is dilated up to 7 mm in the body and tail of the pancreas and gradually tapers through the head of the pancreas into the ampulla. No discrete pancreatic mass lesion evident by MRI although assessment is limited. Given the attenuated appearance of the main pancreatic duct through the head of the pancreas in the background T2 signal, endoscopic ultrasound recommended to further evaluate and more definitively exclude underlying pancreatic head mass lesion. 3. Numerous pancreatic parenchymal cystic foci identified through the body and tail of pancreas with dominant cystic lesions in the tip of the pancreatic tail measuring up to 2.1 cm. Underlying IPMN not excluded. 4. No intra or extrahepatic biliary dilation. No evidence for cholelithiasis or choledocholithiasis. Electronically Signed   By: Camellia Candle M.D.   On: 10/28/2023 09:41   CT ABDOMEN PELVIS W CONTRAST Result Date: 10/27/2023 CLINICAL DATA:  Abdominal pain. EXAM: CT ABDOMEN AND PELVIS WITH CONTRAST TECHNIQUE: Multidetector CT imaging of the abdomen and pelvis was performed using the standard protocol following bolus administration of intravenous contrast. RADIATION DOSE REDUCTION: This exam was performed according to the departmental dose-optimization program which includes automated exposure control, adjustment of the mA and/or kV according to patient size and/or use of iterative reconstruction technique. CONTRAST:  75mL  OMNIPAQUE IOHEXOL 350 MG/ML SOLN COMPARISON:  CT abdomen pelvis dated 12/11/2005. FINDINGS: Lower chest: Scattered small calcified granuloma. No intra-abdominal free air.  Small free fluid in the pelvis. Hepatobiliary: The liver is unremarkable. No biliary ductal dilatation. The gallbladder is unremarkable Pancreas: There is dilatation of the main pancreatic duct measuring up to 5 mm in diameter. Multiple small cystic lesions primarily in the tail of the pancreas are not characterized on this CT, possibly side branch IPMNs. Further evaluation of the pancreas with MRI without and with contrast on a nonemergent/outpatient basis recommended. Mild peripancreatic stranding. Correlation with pancreatic enzymes recommended to exclude acute pancreatitis. Spleen: Normal in size without focal abnormality. Adrenals/Urinary Tract: The adrenal glands unremarkable. The kidneys, visualized ureters, and urinary bladder appear unremarkable. Stomach/Bowel: There is mild sigmoid diverticulosis. There is no bowel obstruction or active inflammation. The appendix is normal. Vascular/Lymphatic: Mild aortoiliac atherosclerotic disease. The IVC is unremarkable. No free gas. There is no adenopathy. Reproductive: The prostate and seminal vesicles are grossly remarkable. Other: None Musculoskeletal: Osteopenia with degenerative changes of the spine. No acute osseous pathology. IMPRESSION: 1. Mild peripancreatic stranding. Correlation with pancreatic enzymes recommended to exclude acute pancreatitis. 2. Dilatation of the pancreatic duct with multiple small cystic pancreatic lesions. Further evaluation of the pancreas with MRI without and with contrast on a nonemergent/outpatient basis recommended. 3. Mild sigmoid diverticulosis. No bowel obstruction. Normal appendix. 4.  Aortic Atherosclerosis (ICD10-I70.0). Electronically Signed   By: Vanetta Chou M.D.   On: 10/27/2023 20:18   DG Chest 2 View Result Date: 10/27/2023 CLINICAL DATA:   Chest pain. EXAM: CHEST - 2 VIEW COMPARISON:  05/10/2023. FINDINGS: The heart size and  mediastinal contours are within normal limits. There is atherosclerotic calcification of the aorta. Chronic elevation left diaphragm is noted. No consolidation, effusion, or pneumothorax is seen. Degenerative changes are noted in the thoracolumbar spine. No acute osseous abnormality. IMPRESSION: No active cardiopulmonary disease. Electronically Signed   By: Leita Birmingham M.D.   On: 10/27/2023 19:20    Microbiology: No results found for this or any previous visit (from the past 240 hours).   Labs: Basic Metabolic Panel: Recent Labs  Lab 10/27/23 1830 10/28/23 0821 10/29/23 0517 10/30/23 0829  NA 138 140 135 135  K 4.0 3.6 4.4 3.6  CL 104 111 103 103  CO2 23 20* 22 24  GLUCOSE 108* 85 90 138*  BUN 8 7* 9 6*  CREATININE 0.85 0.71 0.91 0.77  CALCIUM  9.4 7.3* 8.6* 8.4*   Liver Function Tests: Recent Labs  Lab 10/27/23 1830 10/28/23 0821 10/29/23 0517 10/30/23 0829  AST 20 19 21 23   ALT 16 12 13 13   ALKPHOS 77 58 63 64  BILITOT 1.2 1.2 1.7* 1.3*  PROT 7.0 5.3* 6.2* 6.0*  ALBUMIN 4.0 3.0* 3.3* 3.1*   Recent Labs  Lab 10/27/23 1830 10/28/23 0821  LIPASE 323* 80*   No results for input(s): AMMONIA in the last 168 hours. CBC: Recent Labs  Lab 10/27/23 1830 10/28/23 0821 10/29/23 0517 10/31/23 0515  WBC 7.1 5.6 7.5 3.6*  NEUTROABS  --  4.4  --   --   HGB 14.7 12.9* 13.8 12.7*  HCT 43.1 37.0* 40.6 36.0*  MCV 93.9 94.4 94.4 91.8  PLT 152 107* 101* 125*   Cardiac Enzymes: No results for input(s): CKTOTAL, CKMB, CKMBINDEX, TROPONINI in the last 168 hours. BNP: BNP (last 3 results) No results for input(s): BNP in the last 8760 hours.  ProBNP (last 3 results) No results for input(s): PROBNP in the last 8760 hours.  CBG: No results for input(s): GLUCAP in the last 168 hours.  Signed:  Colen Grimes MD   [facility] 10/31/2023, 11:14 AM

## 2023-10-31 NOTE — Progress Notes (Signed)
 [facility] Progress Note  SUBJECTIVE:   Interval history: Steven Harrison was seen and evaluated today at bedside. Ambulating in room. Abdominal pain improved, tolerated some breakfast this AM. No chest pain or shortness of breath.   Past Medical History:  Diagnosis Date   Cancer (HCC)    SKIN CANCER EAR AND FOREHEAD , FROZEN OFF   Coronary artery disease    coronary stent - 1999 - released by carsdiologist several years ago per patient , 06-24-2018 DENIES ACUTE CARDIAC SX    Hemorrhoids    Past Surgical History:  Procedure Laterality Date   COLONOSCOPY N/A 12/13/2017   Procedure: COLONOSCOPY;  Surgeon: Teresa Lonni HERO, MD;  Location: [facility];  Service: General;  Laterality: N/A;   COLONOSCOPY WITH PROPOFOL  N/A 02/08/2016   Procedure: COLONOSCOPY WITH PROPOFOL ;  Surgeon: Gladis MARLA Louder, MD;  Location: [facility];  Service: Endoscopy;  Laterality: N/A;   HEMORRHOID SURGERY N/A 06/26/2018   Procedure: HEMORRHOIDECTOMY;  Surgeon: Teresa Lonni HERO, MD;  Location: [facility];  Service: General;  Laterality: N/A;   NO PAST SURGERIES     Patient had a stent palced in 1999   POLYPECTOMY  12/13/2017   Procedure: POLYPECTOMY;  Surgeon: Teresa Lonni HERO, MD;  Location: [facility];  Service: General;;   RECTAL EXAM UNDER ANESTHESIA N/A 06/26/2018   Procedure: ANORECTAL EXAM UNDER ANESTHESIA;  Surgeon: Teresa Lonni HERO, MD;  [facility];  Service: General;  Laterality: N/A;   Current Facility-Administered Medications  Medication Dose Route Frequency Provider Last Rate Last Admin   acetaminophen  (TYLENOL ) tablet 650 mg  650 mg Oral Q6H PRN Opyd, Timothy S, MD   650 mg at 10/28/23 2049   albuterol (VENTOLIN HFA) 108 (90 Base) MCG/ACT inhaler 1 puff  1 puff Inhalation Q4H PRN Cindy Garnette MARLA, MD       atorvastatin  (LIPITOR) tablet 20 mg  20 mg Oral Daily Cindy Garnette MARLA, MD   20 mg at 10/31/23 0758   enoxaparin (LOVENOX)  injection 40 mg  40 mg Subcutaneous Q24H Alto Isaiah CROME, NP   40 mg at 10/30/23 1632   feeding supplement (BOOST / RESOURCE BREEZE) liquid 1 Container  1 Container Oral TID BM Alto Isaiah CROME, NP   1 Container at 10/31/23 9241   lactated ringers  infusion   Intravenous Continuous Kriss Estefana DEL, DO 100 mL/hr at 10/31/23 0617 New Bag at 10/31/23 0617   morphine (PF) 2 MG/ML injection 1-4 mg  1-4 mg Intravenous Q2H PRN Alto Isaiah CROME, NP   2 mg at 10/28/23 1557   Oral care mouth rinse  15 mL Mouth Rinse PRN Cindy Garnette MARLA, MD       oxyCODONE  (Oxy IR/ROXICODONE ) immediate release tablet 5 mg  5 mg Oral Q4H PRN Opyd, Timothy S, MD   5 mg at 10/29/23 2218   polyethylene glycol (MIRALAX / GLYCOLAX) packet 17 g  17 g Oral Daily PRN Cindy Garnette MARLA, MD   17 g at 10/30/23 2200   prochlorperazine (COMPAZINE) injection 5 mg  5 mg Intravenous Q4H PRN Opyd, Evalene RAMAN, MD       sodium chloride  flush (NS) 0.9 % injection 3 mL  3 mL Intravenous Q12H Alto Isaiah CROME, NP   3 mL at 10/31/23 0759   tamsulosin (FLOMAX) capsule 0.4 mg  0.4 mg Oral QPC supper Alto Isaiah CROME, NP   0.4 mg at 10/30/23 1748   Allergies as of 10/27/2023   (No Known  Allergies)   Review of Systems:  Review of Systems  Respiratory:  Negative for shortness of breath.   Cardiovascular:  Negative for chest pain.  Gastrointestinal:  Positive for abdominal pain. Negative for nausea and vomiting.    OBJECTIVE:   Temp:  [97.7 F (36.5 C)-98 F (36.7 C)] 97.7 F (36.5 C) (10/22 0841) Pulse Rate:  [53-66] 66 (10/22 0841) Resp:  [18-19] 19 (10/22 0841) BP: (124-130)/(66-73) 124/67 (10/22 0841) SpO2:  [98 %-100 %] 100 % (10/22 0841) Last BM Date : 10/30/23 Physical Exam Constitutional:      General: He is not in acute distress.    Appearance: He is not ill-appearing, toxic-appearing or diaphoretic.  Cardiovascular:     Rate and Rhythm: Normal rate. Rhythm irregular.  Pulmonary:     Effort: No respiratory distress.      Breath sounds: Normal breath sounds.  Abdominal:     General: Bowel sounds are normal. There is no distension.     Palpations: Abdomen is soft.     Tenderness: There is abdominal tenderness. There is no guarding.  Neurological:     Mental Status: He is alert.     Labs: Recent Labs    10/29/23 0517 10/31/23 0515  WBC 7.5 3.6*  HGB 13.8 12.7*  HCT 40.6 36.0*  PLT 101* 125*   BMET Recent Labs    10/29/23 0517 10/30/23 0829  NA 135 135  K 4.4 3.6  CL 103 103  CO2 22 24  GLUCOSE 90 138*  BUN 9 6*  CREATININE 0.91 0.77  CALCIUM  8.6* 8.4*   LFT Recent Labs    10/30/23 0829  PROT 6.0*  ALBUMIN 3.1*  AST 23  ALT 13  ALKPHOS 64  BILITOT 1.3*   PT/INR No results for input(s): LABPROT, INR in the last 72 hours. Diagnostic imaging: No results found.  IMPRESSION: Acute pancreatitis, etiology undetermined Pancreatic ductal dilatation, 7 mm Pancreatic cystic lesion Abdominal pain secondary to #1, improved  PLAN: -Appears stable, ok for DC from GI standpoint, outpatient follow up to consider EUS  -[facility] will be available as needed   LOS: 3 days   Estefana Keas, [facility]

## 2023-10-31 NOTE — TOC Transition Note (Signed)
 Transition of Care [facility]) - Discharge Note   Patient Details  Name: Steven Harrison MRN: [id number] Date of Birth: 1945/03/11  Transition of Care [facility]) CM/SW Contact:  Tom-Johnson, Harvest Muskrat, RN Phone Number: 10/31/2023, 12:24 PM   Clinical Narrative:     Patient is scheduled for discharge today.  Readmission Risk Assessment done. Outpatient f/u, hospital f/u and discharge instructions on AVS. No ICM needs or recommendations noted. Wife, Meade at bedside and will transport at discharge.  No further ICM needs noted.       Final next level of care: Home/Self Care Barriers to Discharge: Barriers Resolved   Patient Goals and CMS Choice Patient states their goals for this hospitalization and ongoing recovery are:: To return home CMS Medicare.gov Compare Post Acute Care list provided to:: Patient Choice offered to / list presented to : Patient, Spouse      Discharge Placement                Patient to be transferred to facility by: Wife Name of family member notified: Meade    Discharge Plan and Services Additional resources added to the After Visit Summary for                  DME Arranged: N/A DME Agency: NA       HH Arranged: NA HH Agency: NA        Social Drivers of Health (SDOH) Interventions SDOH Screenings   Food Insecurity: No Food Insecurity (10/28/2023)  Housing: Low Risk  (10/28/2023)  Transportation Needs: No Transportation Needs (10/28/2023)  Utilities: Not At Risk (10/28/2023)  Social Connections: Moderately Integrated (10/28/2023)  Tobacco Use: Low Risk  (10/27/2023)     Readmission Risk Interventions    10/31/2023   12:23 PM  Readmission Risk Prevention Plan  Post Dischage Appt Complete  Medication Screening Complete  Transportation Screening Complete

## 2023-10-31 NOTE — Progress Notes (Incomplete)
 DISCHARGE NOTE HOME Steven Harrison to be discharged Home per MD order. Floor nurseDiscussed prescriptions and follow up appointments with the patient. Prescriptions given to patient; medication list explained in detail. Patient verbalized understanding.  Skin clean, dry and intact without evidence of skin break down, no evidence of skin tears noted. IV catheter discontinued intact. Site without signs and symptoms of complications. Dressing and pressure applied. Pt denies pain at the site currently. No complaints noted.  Patient free of lines, drains, and wounds.   An After Visit Summary (AVS) was printed and given to the patient. Patient escorted via wheelchair, and discharged home via private auto.  Peyton [name], RN

## 2023-10-31 NOTE — Discharge Summary (Signed)
 Physician Discharge Summary  Steven Harrison FMW:[id number] DOB: August 20, 1945 DOA: 10/27/2023  PCP: Leonel Cole, MD  Admit date: 10/27/2023 Discharge date: 10/31/2023  Time spent: 46 minutes  Recommendations for Outpatient Follow-up:  CC Dr. Kriss on patient discharge to set up outpatient endoscopic ultrasound Requires LFTs lipase etc. in about 1 week  Discharge Diagnoses:  MAIN problem for hospitalization   Acute pancreatits ? cause  Please see below for itemized issues addressed in HOpsital- refer to other progress notes for clarity if needed  Discharge Condition: improved  Diet recommendation: hh  Filed Weights   10/27/23 1811 10/28/23 0928  Weight: 80.3 kg 81.6 kg    History of present illness:  78 year old white male Known underlying history of anal fissure BPH urinary retention in the past followed by Dr. Carolee Trigger finger of middle finger followed by Dr. Murrell CAD status post stent in 1999 on Lipitor  10/19 presented to emergency room with chest abdominal pain and message for him to have diarrhea and high-volume diarrhea Lipase was 323 CT suggestive pancreatitis with multiple small cystic lesions GI consulted and MRCP showed pancreatic duct dilatation to 7 mm with tapering into ampulla and numerous pancreatic parenchymal cystic foci throughout measuring up to 2.1 cm  Patient was treated aggressively for pancreatitis with IV fluids pain management and was placed on escalating opiates and eventually improved  GI evaluated the patient on 10/22 and felt that patient could potentially go home with close outpatient follow-up with GI for EUS which needs to be set up in the outpatient setting by [facility] gastroenterology  Cc PCP Dr. Leonel Cc DR Kriss  Discharge Exam: Vitals:   10/31/23 0402 10/31/23 0841  BP: 130/66 124/67  Pulse: (!) 54 66  Resp:  19  Temp:  97.7 F (36.5 C)  SpO2: 98% 100%    Subj on day of d/c   Well walking halls Some constipation  overnight, no cp Eating   General Exam on discharge  Eomi ncat no focal deficit Cta b no wheeze rales rhonchi Abd soft nt nd no rebound no guard No le edema Power 5/5  Discharge Instructions    Allergies as of 10/31/2023   No Known Allergies      Medication List     STOP taking these medications    albuterol 108 (90 Base) MCG/ACT inhaler Commonly known as: VENTOLIN HFA   tadalafil 10 MG tablet Commonly known as: CIALIS       TAKE these medications    acetaminophen  325 MG tablet Commonly known as: TYLENOL  Take 2 tablets (650 mg total) by mouth every 6 (six) hours as needed for mild pain (pain score 1-3) or headache.   atorvastatin  20 MG tablet Commonly known as: LIPITOR Take 20 mg by mouth daily.   Benefiber Powd Take by mouth as needed.   CALMOSEPTINE EX Apply 1 application topically daily as needed (irritation).   cyanocobalamin 1000 MCG tablet Take 1,000 mcg by mouth daily.   ibuprofen  600 MG tablet Commonly known as: ADVIL  Take 1 tablet (600 mg total) by mouth every 8 (eight) hours as needed for moderate pain.   multivitamin with minerals Tabs tablet Take 1 tablet by mouth daily.   oxyCODONE  5 MG immediate release tablet Commonly known as: Oxy IR/ROXICODONE  Take 1 tablet (5 mg total) by mouth every 4 (four) hours as needed for moderate pain (pain score 4-6).   polyethylene glycol 17 g packet Commonly known as: MIRALAX / GLYCOLAX Take 17 g by mouth daily  as needed.   PRESERVISION AREDS 2 PO Take by mouth daily.   sildenafil  100 MG tablet Commonly known as: VIAGRA  Take 100 mg by mouth daily as needed for erectile dysfunction. 4 per month What changed: Another medication with the same name was removed. Continue taking this medication, and follow the directions you see here.   tamsulosin 0.4 MG Caps capsule Commonly known as: FLOMAX Take 0.4 mg by mouth daily.       No Known Allergies  Follow-up Information     Leonel Cole, MD .    Specialty: Family Medicine Contact information: [address] [place] [address] [phone]         Burnette Fallow, MD. Schedule an appointment as soon as possible for a visit in 1 week(s).   Specialty: Gastroenterology Why: Call office to schedule appointment for hospital follow up after acute pancreatitis and to discuss endoscopic ultrasound with Dr. Burnette to investigate pancreas duct dilation. Contact information: [address] [place] [address] [phone]                  The results of significant diagnostics from this hospitalization (including imaging, microbiology, ancillary and laboratory) are listed below for reference.    Significant Diagnostic Studies: MR 3D Recon At Scanner Result Date: 10/29/2023 CLINICAL DATA:  Abdominal pain.  Pancreatitis suspected. EXAM: MRI ABDOMEN WITHOUT AND WITH CONTRAST (INCLUDING MRCP) TECHNIQUE: Multiplanar multisequence MR imaging of the abdomen was performed both before and after the administration of intravenous contrast. Heavily T2-weighted images of the biliary and pancreatic ducts were obtained, and three-dimensional MRCP images were rendered by post processing. CONTRAST:  8mL GADAVIST GADOBUTROL 1 MMOL/ML IV SOLN COMPARISON:  Abdomen pelvis CT 10/27/2023 FINDINGS: Lower chest: No acute findings Hepatobiliary: No suspicious focal abnormality within the liver parenchyma. There is no evidence for gallstones, gallbladder wall thickening, or pericholecystic fluid. No intrahepatic or extrahepatic biliary dilation. Common bile duct measures 3 mm in the head of the pancreas. Pancreas: Assessment of the pancreas is degraded by motion artifact. Increased T2 signal is seen in around the pancreatic head suggesting edema main pancreatic duct is dilated up to 7 mm in the body and tail of the pancreas and gradually tapers into the ampulla. Numerous pancreatic parenchymal cystic foci identified through the  body and tail of pancreas with dominant cystic lesions in the tip of the pancreatic tail measuring up to 2.1 cm. No suspicious differential enhancement in the head of the pancreas and no discrete focal pancreatic mass lesion evident. Spleen:  No splenomegaly. No suspicious focal mass lesion. Adrenals/Urinary Tract: No adrenal nodule or mass. Kidneys unremarkable. Stomach/Bowel: Stomach is unremarkable. No gastric wall thickening. No evidence of outlet obstruction. Duodenum is normally positioned as is the ligament of Treitz. No small bowel or colonic dilatation within the visualized abdomen. Vascular/Lymphatic: No abdominal aortic aneurysm. No abdominal lymphadenopathy. Other:  No substantial ascites. Musculoskeletal: No focal suspicious marrow enhancement within the visualized bony anatomy. IMPRESSION: 1. Assessment of the pancreas is degraded by motion artifact. Increased T2 signal is seen in around the pancreatic head suggesting edema. Imaging features compatible with acute pancreatitis. 2. Main pancreatic duct is dilated up to 7 mm in the body and tail of the pancreas and gradually tapers through the head of the pancreas into the ampulla. No discrete pancreatic mass lesion evident by MRI although assessment is limited. Given the attenuated appearance of the main pancreatic duct through the head of the pancreas in the  background T2 signal, endoscopic ultrasound recommended to further evaluate and more definitively exclude underlying pancreatic head mass lesion. 3. Numerous pancreatic parenchymal cystic foci identified through the body and tail of pancreas with dominant cystic lesions in the tip of the pancreatic tail measuring up to 2.1 cm. Underlying IPMN not excluded. 4. No intra or extrahepatic biliary dilation. No evidence for cholelithiasis or choledocholithiasis. Electronically Signed   By: Camellia Candle M.D.   On: 10/29/2023 08:51   MR ABDOMEN MRCP W WO CONTAST Result Date: 10/28/2023 CLINICAL DATA:   Abdominal pain.  Pancreatitis suspected. EXAM: MRI ABDOMEN WITHOUT AND WITH CONTRAST (INCLUDING MRCP) TECHNIQUE: Multiplanar multisequence MR imaging of the abdomen was performed both before and after the administration of intravenous contrast. Heavily T2-weighted images of the biliary and pancreatic ducts were obtained, and three-dimensional MRCP images were rendered by post processing. CONTRAST:  8mL GADAVIST GADOBUTROL 1 MMOL/ML IV SOLN COMPARISON:  Abdomen pelvis CT 10/27/2023 FINDINGS: Lower chest: No acute findings Hepatobiliary: No suspicious focal abnormality within the liver parenchyma. There is no evidence for gallstones, gallbladder wall thickening, or pericholecystic fluid. No intrahepatic or extrahepatic biliary dilation. Common bile duct measures 3 mm in the head of the pancreas. Pancreas: Assessment of the pancreas is degraded by motion artifact. Increased T2 signal is seen in around the pancreatic head suggesting edema main pancreatic duct is dilated up to 7 mm in the body and tail of the pancreas and gradually tapers into the ampulla. Numerous pancreatic parenchymal cystic foci identified through the body and tail of pancreas with dominant cystic lesions in the tip of the pancreatic tail measuring up to 2.1 cm. No suspicious differential enhancement in the head of the pancreas and no discrete focal pancreatic mass lesion evident. Spleen:  No splenomegaly. No suspicious focal mass lesion. Adrenals/Urinary Tract: No adrenal nodule or mass. Kidneys unremarkable. Stomach/Bowel: Stomach is unremarkable. No gastric wall thickening. No evidence of outlet obstruction. Duodenum is normally positioned as is the ligament of Treitz. No small bowel or colonic dilatation within the visualized abdomen. Vascular/Lymphatic: No abdominal aortic aneurysm. No abdominal lymphadenopathy. Other:  No substantial ascites. Musculoskeletal: No focal suspicious marrow enhancement within the visualized bony anatomy. IMPRESSION:  1. Assessment of the pancreas is degraded by motion artifact. Increased T2 signal is seen in around the pancreatic head suggesting edema. Imaging features compatible with acute pancreatitis. 2. Main pancreatic duct is dilated up to 7 mm in the body and tail of the pancreas and gradually tapers through the head of the pancreas into the ampulla. No discrete pancreatic mass lesion evident by MRI although assessment is limited. Given the attenuated appearance of the main pancreatic duct through the head of the pancreas in the background T2 signal, endoscopic ultrasound recommended to further evaluate and more definitively exclude underlying pancreatic head mass lesion. 3. Numerous pancreatic parenchymal cystic foci identified through the body and tail of pancreas with dominant cystic lesions in the tip of the pancreatic tail measuring up to 2.1 cm. Underlying IPMN not excluded. 4. No intra or extrahepatic biliary dilation. No evidence for cholelithiasis or choledocholithiasis. Electronically Signed   By: Camellia Candle M.D.   On: 10/28/2023 09:41   CT ABDOMEN PELVIS W CONTRAST Result Date: 10/27/2023 CLINICAL DATA:  Abdominal pain. EXAM: CT ABDOMEN AND PELVIS WITH CONTRAST TECHNIQUE: Multidetector CT imaging of the abdomen and pelvis was performed using the standard protocol following bolus administration of intravenous contrast. RADIATION DOSE REDUCTION: This exam was performed according to the departmental dose-optimization program which includes  automated exposure control, adjustment of the mA and/or kV according to patient size and/or use of iterative reconstruction technique. CONTRAST:  75mL OMNIPAQUE IOHEXOL 350 MG/ML SOLN COMPARISON:  CT abdomen pelvis dated 12/11/2005. FINDINGS: Lower chest: Scattered small calcified granuloma. No intra-abdominal free air.  Small free fluid in the pelvis. Hepatobiliary: The liver is unremarkable. No biliary ductal dilatation. The gallbladder is unremarkable Pancreas: There is  dilatation of the main pancreatic duct measuring up to 5 mm in diameter. Multiple small cystic lesions primarily in the tail of the pancreas are not characterized on this CT, possibly side branch IPMNs. Further evaluation of the pancreas with MRI without and with contrast on a nonemergent/outpatient basis recommended. Mild peripancreatic stranding. Correlation with pancreatic enzymes recommended to exclude acute pancreatitis. Spleen: Normal in size without focal abnormality. Adrenals/Urinary Tract: The adrenal glands unremarkable. The kidneys, visualized ureters, and urinary bladder appear unremarkable. Stomach/Bowel: There is mild sigmoid diverticulosis. There is no bowel obstruction or active inflammation. The appendix is normal. Vascular/Lymphatic: Mild aortoiliac atherosclerotic disease. The IVC is unremarkable. No free gas. There is no adenopathy. Reproductive: The prostate and seminal vesicles are grossly remarkable. Other: None Musculoskeletal: Osteopenia with degenerative changes of the spine. No acute osseous pathology. IMPRESSION: 1. Mild peripancreatic stranding. Correlation with pancreatic enzymes recommended to exclude acute pancreatitis. 2. Dilatation of the pancreatic duct with multiple small cystic pancreatic lesions. Further evaluation of the pancreas with MRI without and with contrast on a nonemergent/outpatient basis recommended. 3. Mild sigmoid diverticulosis. No bowel obstruction. Normal appendix. 4.  Aortic Atherosclerosis (ICD10-I70.0). Electronically Signed   By: Vanetta Chou M.D.   On: 10/27/2023 20:18   DG Chest 2 View Result Date: 10/27/2023 CLINICAL DATA:  Chest pain. EXAM: CHEST - 2 VIEW COMPARISON:  05/10/2023. FINDINGS: The heart size and mediastinal contours are within normal limits. There is atherosclerotic calcification of the aorta. Chronic elevation left diaphragm is noted. No consolidation, effusion, or pneumothorax is seen. Degenerative changes are noted in the  thoracolumbar spine. No acute osseous abnormality. IMPRESSION: No active cardiopulmonary disease. Electronically Signed   By: Leita Birmingham M.D.   On: 10/27/2023 19:20    Microbiology: No results found for this or any previous visit (from the past 240 hours).   Labs: Basic Metabolic Panel: Recent Labs  Lab 10/27/23 1830 10/28/23 0821 10/29/23 0517 10/30/23 0829 10/31/23 0515  NA 138 140 135 135 136  K 4.0 3.6 4.4 3.6 3.8  CL 104 111 103 103 104  CO2 23 20* 22 24 26   GLUCOSE 108* 85 90 138* 113*  BUN 8 7* 9 6* 6*  CREATININE 0.85 0.71 0.91 0.77 0.69  CALCIUM  9.4 7.3* 8.6* 8.4* 8.4*   Liver Function Tests: Recent Labs  Lab 10/27/23 1830 10/28/23 0821 10/29/23 0517 10/30/23 0829 10/31/23 0515  AST 20 19 21 23 26   ALT 16 12 13 13 19   ALKPHOS 77 58 63 64 59  BILITOT 1.2 1.2 1.7* 1.3* 0.8  PROT 7.0 5.3* 6.2* 6.0* 5.9*  ALBUMIN 4.0 3.0* 3.3* 3.1* 3.2*   Recent Labs  Lab 10/27/23 1830 10/28/23 0821  LIPASE 323* 80*   No results for input(s): AMMONIA in the last 168 hours. CBC: Recent Labs  Lab 10/27/23 1830 10/28/23 0821 10/29/23 0517 10/31/23 0515  WBC 7.1 5.6 7.5 3.6*  NEUTROABS  --  4.4  --   --   HGB 14.7 12.9* 13.8 12.7*  HCT 43.1 37.0* 40.6 36.0*  MCV 93.9 94.4 94.4 91.8  PLT 152 107* 101*  125*   Cardiac Enzymes: No results for input(s): CKTOTAL, CKMB, CKMBINDEX, TROPONINI in the last 168 hours. BNP: BNP (last 3 results) No results for input(s): BNP in the last 8760 hours.  ProBNP (last 3 results) No results for input(s): PROBNP in the last 8760 hours.  CBG: No results for input(s): GLUCAP in the last 168 hours.  Signed:  Colen Grimes MD   [facility] 10/31/2023, 11:48 AM

## 2023-11-05 ENCOUNTER — Other Ambulatory Visit: Payer: Self-pay | Admitting: Gastroenterology

## 2023-11-05 DIAGNOSIS — K8689 Other specified diseases of pancreas: Secondary | ICD-10-CM | POA: Diagnosis not present

## 2023-11-05 DIAGNOSIS — K862 Cyst of pancreas: Secondary | ICD-10-CM | POA: Diagnosis not present

## 2023-11-19 ENCOUNTER — Other Ambulatory Visit: Payer: Self-pay | Admitting: Gastroenterology

## 2023-12-04 ENCOUNTER — Encounter (HOSPITAL_COMMUNITY): Payer: Self-pay | Admitting: Gastroenterology

## 2023-12-12 ENCOUNTER — Other Ambulatory Visit: Payer: Self-pay

## 2023-12-12 ENCOUNTER — Encounter (HOSPITAL_COMMUNITY): Payer: Self-pay | Admitting: Gastroenterology

## 2023-12-12 ENCOUNTER — Ambulatory Visit (HOSPITAL_COMMUNITY): Admitting: Anesthesiology

## 2023-12-12 ENCOUNTER — Encounter (HOSPITAL_COMMUNITY)
Admission: RE | Disposition: A | Discharge status: Home / Self Care | Payer: Self-pay | Source: Home / Self Care | Attending: Gastroenterology

## 2023-12-12 ENCOUNTER — Ambulatory Visit (HOSPITAL_COMMUNITY)
Admission: RE | Admit: 2023-12-12 | Discharge: 2023-12-12 | Disposition: A | Attending: Gastroenterology | Admitting: Gastroenterology

## 2023-12-12 DIAGNOSIS — E7800 Pure hypercholesterolemia, unspecified: Secondary | ICD-10-CM

## 2023-12-12 DIAGNOSIS — K8689 Other specified diseases of pancreas: Secondary | ICD-10-CM | POA: Diagnosis not present

## 2023-12-12 DIAGNOSIS — I2510 Atherosclerotic heart disease of native coronary artery without angina pectoris: Secondary | ICD-10-CM

## 2023-12-12 DIAGNOSIS — Z955 Presence of coronary angioplasty implant and graft: Secondary | ICD-10-CM | POA: Diagnosis not present

## 2023-12-12 DIAGNOSIS — M1990 Unspecified osteoarthritis, unspecified site: Secondary | ICD-10-CM | POA: Diagnosis not present

## 2023-12-12 DIAGNOSIS — Z8719 Personal history of other diseases of the digestive system: Secondary | ICD-10-CM | POA: Diagnosis not present

## 2023-12-12 DIAGNOSIS — Z79899 Other long term (current) drug therapy: Secondary | ICD-10-CM | POA: Diagnosis not present

## 2023-12-12 HISTORY — PX: EUS: SHX5427

## 2023-12-12 HISTORY — PX: ESOPHAGOGASTRODUODENOSCOPY: SHX5428

## 2023-12-12 SURGERY — ULTRASOUND, UPPER GI TRACT, ENDOSCOPIC
Anesthesia: Monitor Anesthesia Care

## 2023-12-12 MED ORDER — SODIUM CHLORIDE 0.9 % IV SOLN: INTRAVENOUS | Status: DC

## 2023-12-12 MED ORDER — LIDOCAINE 2% (20 MG/ML) 5 ML SYRINGE
INTRAMUSCULAR | Status: DC | PRN
  Administered 2023-12-12: 40 mg via INTRAVENOUS

## 2023-12-12 MED ORDER — PROPOFOL 10 MG/ML IV BOLUS
INTRAVENOUS | Status: DC | PRN
  Administered 2023-12-12: 200 ug/kg/min via INTRAVENOUS
  Administered 2023-12-12 (×2): 20 mg via INTRAVENOUS

## 2023-12-12 MED ORDER — SODIUM CHLORIDE 0.9 % IV SOLN
INTRAVENOUS | Status: AC | PRN
  Administered 2023-12-12: 500 mL via INTRAMUSCULAR

## 2023-12-12 NOTE — H&P (Signed)
 [facility] H/P Note  Chief Complaint:  dilated pancreatic duct HPI: Steven Harrison is an 78 y.o. male.  Here EUS for evaluation of pancreatitis with dilated PD.  He has improved from pancreatitis, no pain, eating well.  Past Medical History:  Diagnosis Date   Cancer (HCC)    SKIN CANCER EAR AND FOREHEAD , FROZEN OFF   Coronary artery disease    coronary stent - 1999 - released by carsdiologist several years ago per patient , 06-24-2018 DENIES ACUTE CARDIAC SX    Hemorrhoids     Past Surgical History:  Procedure Laterality Date   COLONOSCOPY N/A 12/13/2017   Procedure: COLONOSCOPY;  Surgeon: Teresa Lonni HERO, MD;  Location: [facility];  Service: General;  Laterality: N/A;   COLONOSCOPY WITH PROPOFOL  N/A 02/08/2016   Procedure: COLONOSCOPY WITH PROPOFOL ;  Surgeon: Gladis MARLA Louder, MD;  Location: [facility];  Service: Endoscopy;  Laterality: N/A;   HEMORRHOID SURGERY N/A 06/26/2018   Procedure: HEMORRHOIDECTOMY;  Surgeon: Teresa Lonni HERO, MD;  Location: [facility];  Service: General;  Laterality: N/A;   NO PAST SURGERIES     Patient had a stent palced in 1999   POLYPECTOMY  12/13/2017   Procedure: POLYPECTOMY;  Surgeon: Teresa Lonni HERO, MD;  Location: [facility];  Service: General;;   RECTAL EXAM UNDER ANESTHESIA N/A 06/26/2018   Procedure: ANORECTAL EXAM UNDER ANESTHESIA;  Surgeon: Teresa Lonni HERO, MD;  Location: [facility];  Service: General;  Laterality: N/A;    Medications Prior to Admission  Medication Sig Dispense Refill   acetaminophen  (TYLENOL ) 325 MG tablet Take 2 tablets (650 mg total) by mouth every 6 (six) hours as needed for mild pain (pain score 1-3) or headache.     atorvastatin  (LIPITOR) 20 MG tablet Take 20 mg by mouth daily.      cyanocobalamin 1000 MCG tablet Take 1,000 mcg by mouth daily.     ibuprofen  (ADVIL ) 600 MG tablet Take 1 tablet (600 mg total) by mouth every 8 (eight) hours as needed for  moderate pain. 30 tablet 0   Menthol-Zinc Oxide (CALMOSEPTINE EX) Apply 1 application topically daily as needed (irritation).     Multiple Vitamin (MULTIVITAMIN WITH MINERALS) TABS tablet Take 1 tablet by mouth daily.     Multiple Vitamins-Minerals (PRESERVISION AREDS 2 PO) Take by mouth daily.     oxyCODONE  (OXY IR/ROXICODONE ) 5 MG immediate release tablet Take 1 tablet (5 mg total) by mouth every 4 (four) hours as needed for moderate pain (pain score 4-6). 30 tablet 0   polyethylene glycol (MIRALAX  / GLYCOLAX ) 17 g packet Take 17 g by mouth daily as needed.     sildenafil  (VIAGRA ) 100 MG tablet Take 100 mg by mouth daily as needed for erectile dysfunction. 4 per month     tamsulosin  (FLOMAX ) 0.4 MG CAPS capsule Take 0.4 mg by mouth daily.     Wheat Dextrin (BENEFIBER) POWD Take by mouth as needed.      Allergies: No Known Allergies  History reviewed. No pertinent family history.  Social History:  reports that he has never smoked. He has never used smokeless tobacco. He reports that he does not drink alcohol and does not use drugs.   ROS: As per HPI, all others negative   Blood pressure (!) 118/50, pulse (!) 58, temperature (!) 97.5 F (36.4 C), temperature source Temporal, resp. rate 15, height 5' 8 (1.727 m), weight 79.4 kg, SpO2 100%. General appearance: NAD HEENT:  [place]/AT, anicteric  CV:  No tachycardia LUNGS:  No visible distress ABD:  Soft, non-tender NEURO:  No encephalopathy  No results found for this or any previous visit (from the past 48 hours). No results found.  Assessment/Plan   Dilated pancreatic duct. Upper endoscopic ultrasound with possible fine needle aspiration. Risks (bleeding, infection, bowel perforation that could require surgery, sedation-related changes in cardiopulmonary systems), benefits (identification and possible treatment of source of symptoms, exclusion of certain causes of symptoms), and alternatives (watchful waiting, radiographic imaging  studies, empiric medical treatment) of upper endoscopy with ultrasound and possible fine needle aspiration (EUS +/- FNA) were explained to patient/family in detail and patient wishes to proceed.   BURNETTE ELSIE HERO 12/12/2023, 11:10 AM

## 2023-12-12 NOTE — Op Note (Signed)
 [facility] Patient Name: Steven Harrison Procedure Date: 12/12/2023 MRN: [id number] Attending MD: Elsie Cree , MD, [id number] Date of Birth: 1945-03-29 CSN: [id number] Age: 78 Admit Type: Outpatient Procedure:                Upper EUS Indications:              Dilated pancreatic duct on MRI Providers:                Elsie Cree, MD, Robie Breed, RN,                            Haskel Chris, Technician Referring MD:             Cheryle Frees, MD Medicines:                Monitored Anesthesia Care Complications:            No immediate complications. Estimated Blood Loss:     Estimated blood loss: none. Procedure:                Pre-Anesthesia Assessment:                           - Prior to the procedure, a History and Physical                            was performed, and patient medications and                            allergies were reviewed. The patient's tolerance of                            previous anesthesia was also reviewed. The risks                            and benefits of the procedure and the sedation                            options and risks were discussed with the patient.                            All questions were answered, and informed consent                            was obtained. Prior Anticoagulants: The patient has                            taken no anticoagulant or antiplatelet agents. ASA                            Grade Assessment: II - A patient with mild systemic                            disease. After reviewing the risks and benefits,  the patient was deemed in satisfactory condition to                            undergo the procedure.                           After obtaining informed consent, the endoscope was                            passed under direct vision. Throughout the                            procedure, the patient's blood pressure, pulse, and                             oxygen saturations were monitored continuously. The                            [id number] ([id number] Olympus ultrasound scope was                            introduced through the mouth, and advanced to the                            second part of duodenum. The upper EUS was                            accomplished without difficulty. The patient                            tolerated the procedure well. Scope In: Scope Out: Findings:      ENDOSONOGRAPHIC FINDING: :      There was no sign of significant endosonographic abnormality in the       ampulla. No fishmouth appearance to ampulla.      The pancreatic duct had a dilated endosonographic appearance in the body       of the pancreas, tail of the pancreas and main pancreatic duct.       Pancreatic duct in head/uncinate pancreas was of normal caliber. The       pancreatic duct measured up to 5 mm in diameter.      No lymphadenopathy seen.      There was no sign of significant endosonographic abnormality in the left       lobe of the liver. Homogeneous parenchyma was identified.      There was no sign of significant endosonographic abnormality in the       common bile duct. The maximum diameter of the duct was 5 mm.      Pancreas no mass. Few tiny sub-cm cysts with some communication with       side branch PD. Impression:               - There was no sign of significant pathology in the                            ampulla.                           -  The pancreatic duct had a dilated endosonographic                            appearance in the body of the pancreas, tail of the                            pancreas and main pancreatic duct. The pancreatic                            duct measured up to 5 mm in diameter.                           - There was no evidence of significant pathology in                            the left lobe of the liver.                           - There was no sign of significant pathology in the                             common bile duct.                           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 Moderate Sedation:      Not Applicable - Patient had care per Anesthesia. Recommendation:           - Discharge patient to home (ambulatory).                           - Resume previous diet today.                           - Continue present medications.                           - Return to GI clinic at appointment to be                            scheduled. We can discuss further MRI surveillance                            versus surgical referral. Procedure Code(s):        --- Professional ---                           [id number], Esophagogastroduodenoscopy, flexible,                            transoral; with endoscopic ultrasound examination,  including the esophagus, stomach, and either the                            duodenum or a surgically altered stomach where the                            jejunum is examined distal to the anastomosis Diagnosis Code(s):        --- Professional ---                           K86.89, Other specified diseases of pancreas                           R93.3, Abnormal findings on diagnostic imaging of                            other parts of digestive tract CPT copyright 2022 [employer]. All rights reserved. The codes documented in this report are preliminary and upon coder review may  be revised to meet current compliance requirements. Elsie Cree, MD 12/12/2023 12:04:27 PM This report has been signed electronically. Number of Addenda: 0

## 2023-12-12 NOTE — Anesthesia Preprocedure Evaluation (Addendum)
 Anesthesia Evaluation  Patient identified by MRN, date of birth, ID band Patient awake    Reviewed: Allergy & Precautions, NPO status , Patient's Chart, lab work & pertinent test results  History of Anesthesia Complications Negative for: history of anesthetic complications  Airway Mallampati: II  TM Distance: >3 FB Neck ROM: Full    Dental no notable dental hx.    Pulmonary neg pulmonary ROS   Pulmonary exam normal        Cardiovascular + CAD  Normal cardiovascular exam     Neuro/Psych negative neurological ROS     GI/Hepatic Neg liver ROS,,,dilated pancreatic duct   Endo/Other  negative endocrine ROS    Renal/GU negative Renal ROS     Musculoskeletal  (+) Arthritis ,    Abdominal   Peds  Hematology negative hematology ROS (+)   Anesthesia Other Findings   Reproductive/Obstetrics                              Anesthesia Physical Anesthesia Plan  ASA: 2  Anesthesia Plan: MAC   Post-op Pain Management: Minimal or no pain anticipated   Induction:   PONV Risk Score and Plan: 2 and Treatment may vary due to age or medical condition and Propofol  infusion  Airway Management Planned: Natural Airway and Nasal Cannula  Additional Equipment: None  Intra-op Plan:   Post-operative Plan:   Informed Consent: I have reviewed the patients History and Physical, chart, labs and discussed the procedure including the risks, benefits and alternatives for the proposed anesthesia with the patient or authorized representative who has indicated his/her understanding and acceptance.       Plan Discussed with: CRNA  Anesthesia Plan Comments:          Anesthesia Quick Evaluation

## 2023-12-12 NOTE — Anesthesia Postprocedure Evaluation (Signed)
 Anesthesia Post Note  Patient: Steven Harrison  Procedure(s) Performed: ULTRASOUND, UPPER GI TRACT, ENDOSCOPIC EGD (ESOPHAGOGASTRODUODENOSCOPY)     Patient location during evaluation: PACU Anesthesia Type: MAC Level of consciousness: awake and alert Pain management: pain level controlled Vital Signs Assessment: post-procedure vital signs reviewed and stable Respiratory status: spontaneous breathing, nonlabored ventilation and respiratory function stable Cardiovascular status: blood pressure returned to baseline Postop Assessment: no apparent nausea or vomiting Anesthetic complications: no   No notable events documented.  Last Vitals:  Vitals:   12/12/23 1031 12/12/23 1151  BP: (!) 118/50 (!) 106/53  Pulse: (!) 58 (!) 57  Resp: 15 11  Temp: (!) 36.4 C (!) 36.3 C  SpO2: 100% 99%    Last Pain:  Vitals:   12/12/23 1151  TempSrc: Temporal  PainSc: 0-No pain                 Vertell Row

## 2023-12-12 NOTE — Discharge Instructions (Signed)
Endoscopy °Care After °Please read the instructions outlined below and refer to this sheet in the next few weeks. These discharge instructions provide you with general information on caring for yourself after you leave the hospital. Your doctor may also give you specific instructions. While your treatment has been planned according to the most current medical practices available, unavoidable complications occasionally occur. If you have any problems or questions after discharge, please call Dr. Anderson Coppock ([facility]) at [phone]. ° °HOME CARE INSTRUCTIONS °Activity °· You may resume your regular activity but move at a slower pace for the next 24 hours.  °· Take frequent rest periods for the next 24 hours.  °· Walking will help expel (get rid of) the air and reduce the bloated feeling in your abdomen.  °· No driving for 24 hours (because of the anesthesia (medicine) used during the test).  °· You may shower.  °· Do not sign any important legal documents or operate any machinery for 24 hours (because of the anesthesia used during the test).  °Nutrition °· Drink plenty of fluids.  °· You may resume your normal diet.  °· Begin with a light meal and progress to your normal diet.  °· Avoid alcoholic beverages for 24 hours or as instructed by your caregiver.  °Medications °You may resume your normal medications unless your caregiver tells you otherwise. °What you can expect today °· You may experience abdominal discomfort such as a feeling of fullness or "gas" pains.  °· You may experience a sore throat for 2 to 3 days. This is normal. Gargling with salt water may help this.  °·  °SEEK IMMEDIATE MEDICAL CARE IF: °· You have excessive nausea (feeling sick to your stomach) and/or vomiting.  °· You have severe abdominal pain and distention (swelling).  °· You have trouble swallowing.  °· You have a temperature over 100° F (37.8° C).  °· You have rectal bleeding or vomiting of blood.  °Document Released:  08/10/2003 Document Revised: 09/07/2010 Document Reviewed: 02/20/2007 °[employer]® Patient Information ©2012 [employer]. °

## 2023-12-12 NOTE — Transfer of Care (Addendum)
 Immediate Anesthesia Transfer of Care Note  Patient: Steven Harrison  Procedure(s) Performed: ULTRASOUND, UPPER GI TRACT, ENDOSCOPIC EGD (ESOPHAGOGASTRODUODENOSCOPY)  Patient Location: Endoscopy Unit  Anesthesia Type:MAC  Level of Consciousness: drowsy and patient cooperative  Airway & Oxygen Therapy: Patient Spontanous Breathing and Patient connected to face mask oxygen  Post-op Assessment: Report given to RN and Post -op Vital signs reviewed and stable  Post vital signs: Reviewed and stable  Last Vitals:  Vitals Value Taken Time  BP 106/58 12/12/23 11:48  Temp 36.3 12/12/23   11:51  Pulse 63 12/12/23 11:49  Resp 10 12/12/23 11:49  SpO2 99 % 12/12/23 11:49  Vitals shown include unfiled device data.  Last Pain:  Vitals:   12/12/23 1031  TempSrc: Temporal  PainSc: 0-No pain         Complications: No notable events documented.

## 2023-12-13 ENCOUNTER — Encounter (HOSPITAL_COMMUNITY): Payer: Self-pay | Admitting: Gastroenterology

## 2023-12-13 DIAGNOSIS — R933 Abnormal findings on diagnostic imaging of other parts of digestive tract: Secondary | ICD-10-CM | POA: Diagnosis not present

## 2023-12-13 DIAGNOSIS — K8689 Other specified diseases of pancreas: Secondary | ICD-10-CM | POA: Diagnosis not present
